# Patient Record
Sex: Male | Born: 1960 | Race: White | Hispanic: No | Marital: Married | Smoking: Never smoker
Health system: Southern US, Community
[De-identification: ages and names within clinical notes are randomized; demographics above are authoritative.]

## PROBLEM LIST (undated history)

## (undated) DIAGNOSIS — C801 Malignant (primary) neoplasm, unspecified: Secondary | ICD-10-CM

## (undated) DIAGNOSIS — M1990 Unspecified osteoarthritis, unspecified site: Secondary | ICD-10-CM

## (undated) DIAGNOSIS — R112 Nausea with vomiting, unspecified: Secondary | ICD-10-CM

## (undated) DIAGNOSIS — Z9889 Other specified postprocedural states: Secondary | ICD-10-CM

## (undated) HISTORY — PX: LITHOTRIPSY: SUR834

---

## 1995-09-15 HISTORY — PX: MICRODISCECTOMY LUMBAR: SUR864

## 2002-12-26 ENCOUNTER — Encounter: Payer: Self-pay | Admitting: Family Medicine

## 2002-12-26 ENCOUNTER — Encounter: Admission: RE | Admit: 2002-12-26 | Discharge: 2002-12-26 | Payer: Self-pay | Admitting: Family Medicine

## 2005-01-16 ENCOUNTER — Encounter: Admission: RE | Admit: 2005-01-16 | Discharge: 2005-01-16 | Payer: Self-pay | Admitting: Family Medicine

## 2007-09-15 HISTORY — PX: CYSTOSCOPY/RETROGRADE/URETEROSCOPY: SHX5316

## 2008-03-02 ENCOUNTER — Emergency Department (HOSPITAL_COMMUNITY): Admission: EM | Admit: 2008-03-02 | Discharge: 2008-03-02 | Payer: Self-pay | Admitting: Emergency Medicine

## 2008-03-02 ENCOUNTER — Ambulatory Visit (HOSPITAL_COMMUNITY): Admission: RE | Admit: 2008-03-02 | Discharge: 2008-03-02 | Payer: Self-pay | Admitting: Urology

## 2008-03-15 ENCOUNTER — Ambulatory Visit (HOSPITAL_COMMUNITY): Admission: RE | Admit: 2008-03-15 | Discharge: 2008-03-15 | Payer: Self-pay | Admitting: Urology

## 2009-04-20 IMAGING — CR DG ABDOMEN 1V
2 series · 2 of 2 positions shown · non-contrast
Comparison: CT of abdomen dated 03/02/2008

CLINICAL DATA: Right ureteral calculus.  The patient is scheduled
for lithotripsy.

ABDOMEN - 1 VIEW

[t abdomen supine (1 of 2)]
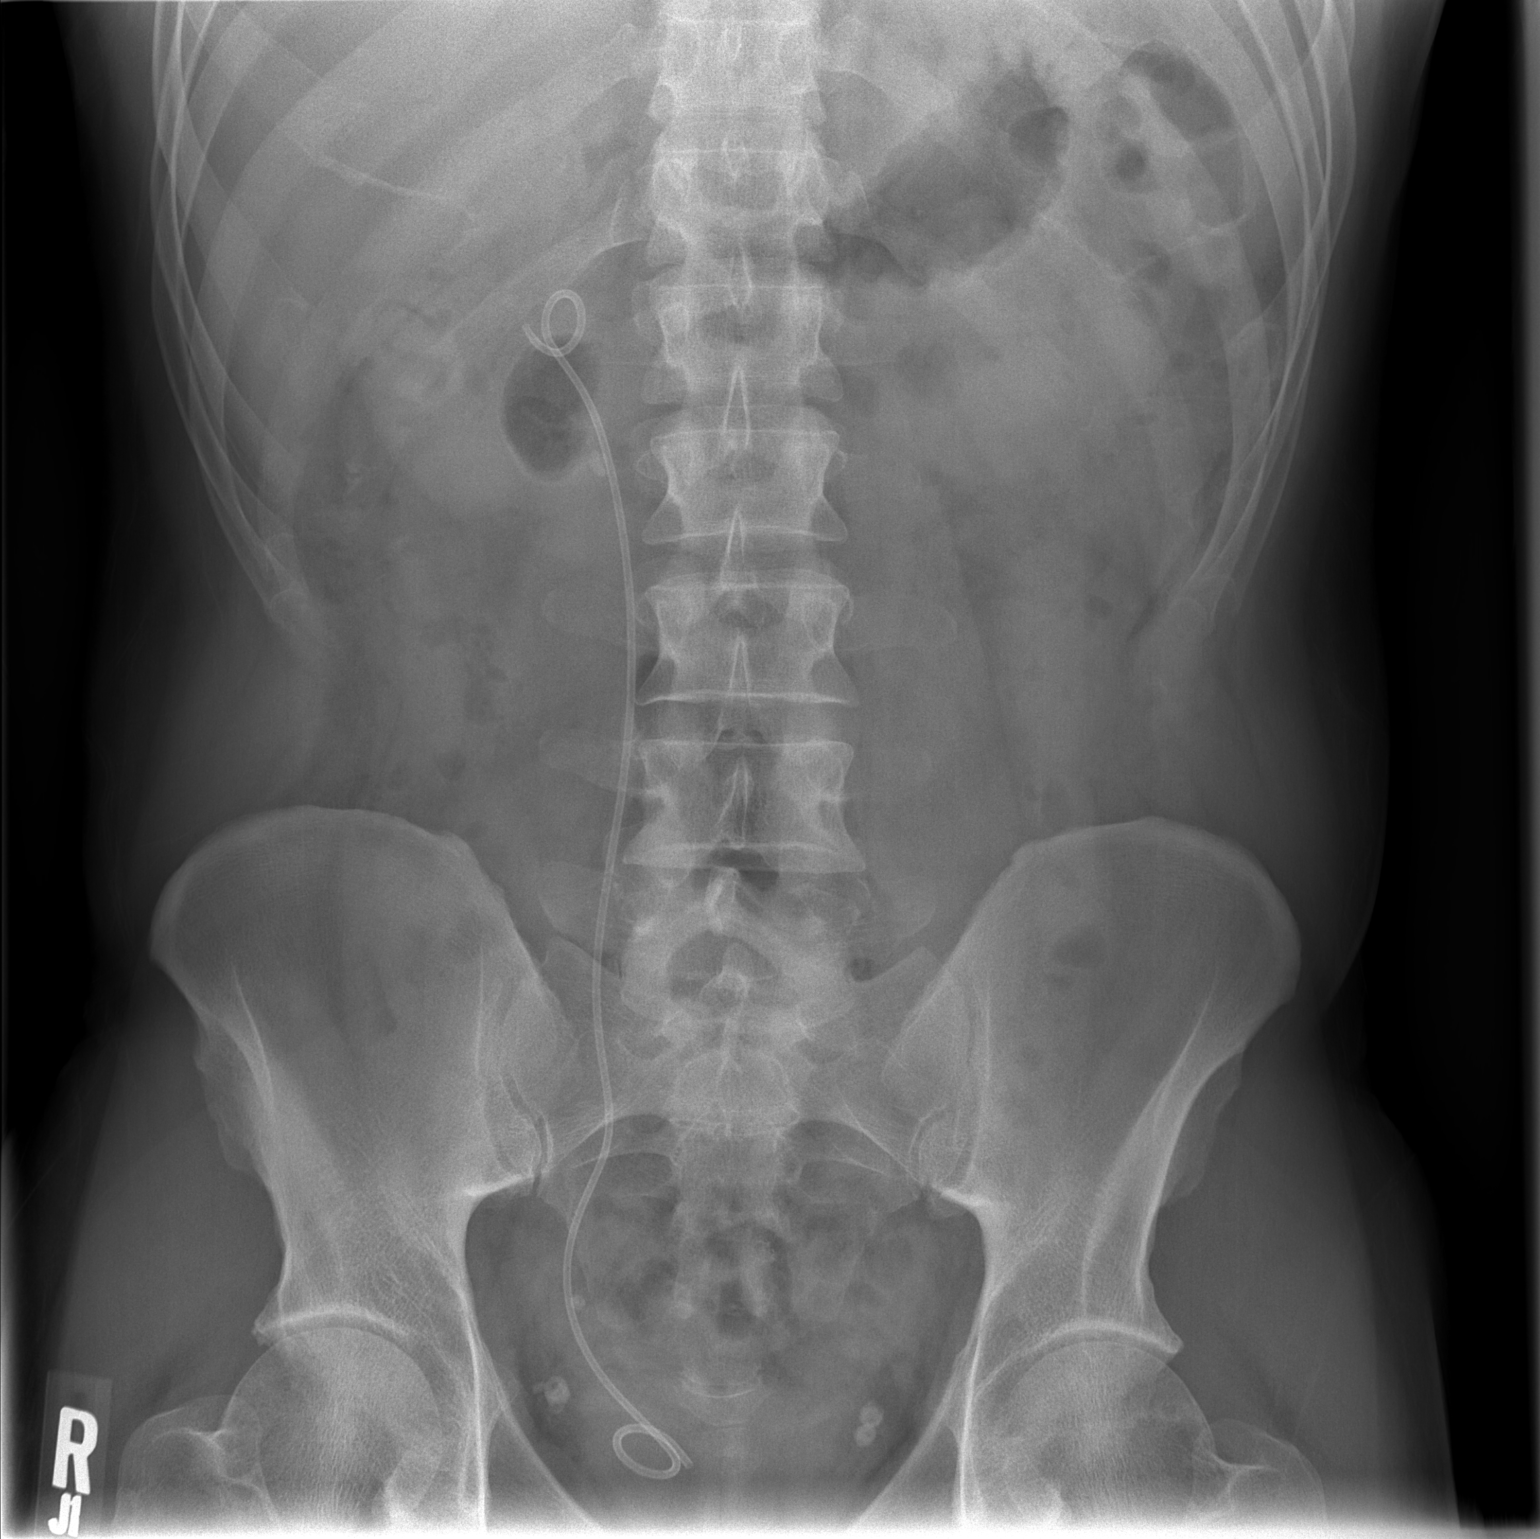

[t abdomen supine (2 of 2)]
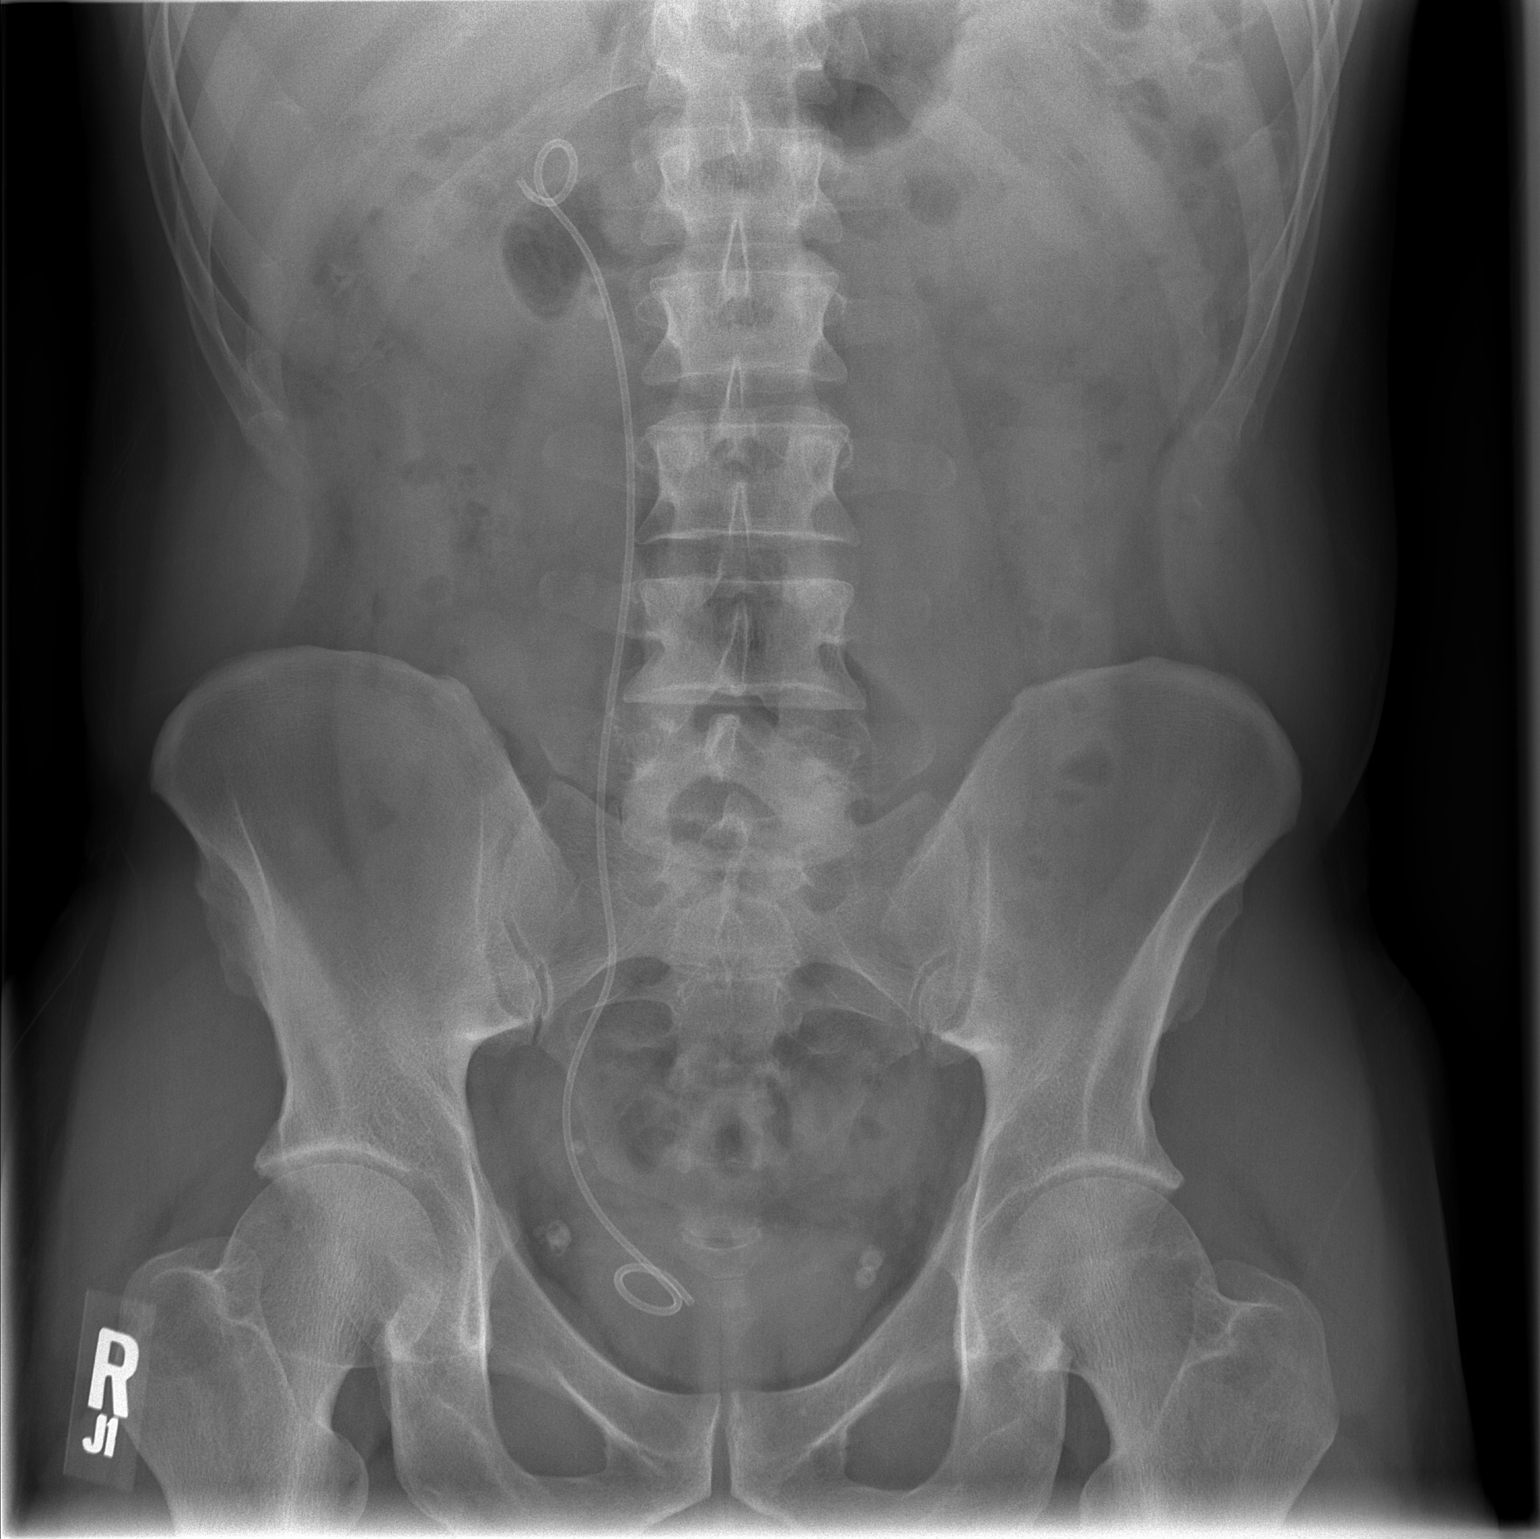

[2 of 2 positions shown; findings below may reference images not displayed]

FINDINGS: Interval right ureteral stent placement extending from
kidney to bladder.  Adjacent ureteral calculus again present in the
proximal right ureter.  The calculus is taller than it is wide with
maximal diameter of approximately 8 mm.  No other renal calculi
present.  Calcified phleboliths are present in the pelvis.  No
evidence of bowel obstruction.
IMPRESSION: 8 mm calculus in the proximal right ureter adjacent to the ureteral
stent.

## 2010-01-10 ENCOUNTER — Ambulatory Visit (HOSPITAL_BASED_OUTPATIENT_CLINIC_OR_DEPARTMENT_OTHER): Admission: RE | Admit: 2010-01-10 | Discharge: 2010-01-10 | Payer: Self-pay | Admitting: Orthopedic Surgery

## 2010-09-14 HISTORY — PX: PARTIAL COLECTOMY: SHX5273

## 2010-09-14 HISTORY — PX: PORT-A-CATH REMOVAL: SHX5289

## 2010-12-02 LAB — POCT HEMOGLOBIN-HEMACUE: Hemoglobin: 17.3 g/dL — ABNORMAL HIGH (ref 13.0–17.0)

## 2011-01-27 NOTE — Op Note (Signed)
Derek Crosby, Derek Crosby                 ACCOUNT NO.:  [id number]   MEDICAL RECORD NO.:  [id number]          PATIENT TYPE:  AMB   LOCATION:  DAY                          FACILITY:  [facility]   PHYSICIAN:  Jamison Neighbor, M.D.  DATE OF BIRTH:  May 04, 1961   DATE OF PROCEDURE:  03/02/2008  DATE OF DISCHARGE:                               OPERATIVE REPORT   PREOPERATIVE DIAGNOSIS:  Right proximal ureteral calculus.   POSTOPERATIVE DIAGNOSIS:  Right proximal ureteral calculus.   PROCEDURE:  Cystoscopy, right retrograde, right ureteroscopy and right  double-J catheter insertion.   SURGEON:  Dr. Logan Bores.   ANESTHESIA:  Was general.   COMPLICATIONS:  Is inability to detect stone in proximal ureter, stone  most likely migrated back to kidney.   DRAIN:  Is a 6-French x 28-cm double-J catheter.   BRIEF HISTORY:  This 50 year old male was found on CT scan to have a  stone in the proximal ureter.  The patient has significant renal colic  and would like to have a stent placed.  He is now to undergo immediate  lithotripsy, because he has just had some Toradol.  The patient is now  to undergo stent placement with possible ureteroscopy if the stone has  migrated.  He understands the risks and benefits of procedure and gave  full informed consent.   PROCEDURE:  After successful induction of general anesthesia, the  patient was placed in the dorsal position, prepped with Betadine and  draped in the usual sterile fashion.  Cystoscopy was performed, and the  urethra was visualized in its entirety and found to be normal.  Beyond  the verumontanum, there was no prostatic enlargement.  The bladder was  carefully inspected.  No tumors or stones could be seen.  Both ureteral  orifices were normal in configuration and location.  Retrograde studies  were performed on the right-hand side through a 6-French ureteral  catheter.  This showed a normal ureter.  The stone could not be well-  visualized.  There was some  degree of dilation in the collecting system.  Due to the poor quality of the images obtained in Room 8, it was  difficult to see whether the stone was present in that proximal ureter  or not.  A guidewire was passed up to the kidney.  Ureteroscope was then  introduced alongside the guidewire.  The distal ureter was very tight,  so a balloon dilator was used to open that up.  The ureteroscope then  could be advanced all the way to the UPJ, and a stone could not be seen.  This would certainly suggest that the stone had migrated back into the  kidney.  The guidewire was left in place.  The ureteroscope was  withdrawn.  The cystoscope was loaded back over the guidewire.  The  patient had a double-J catheter passed over the guidewire and was  allowed to coil normally in the bladder as well as up within the area of  the pelvis.  The bladder was drained.  The patient was taken to the  recovery in good condition.  He will be sent home with Ditropan and  Pyridium Plus as well as Macrodantin to take while the stent is in  place.  He will continue with his pain medication as needed and will  follow-up with Dr. Vernie Ammons for probable with ESWL.      Jamison Neighbor, M.D.  Electronically Signed     RJE/MEDQ  D:  03/02/2008  T:  03/02/2008  Job:  [id number]

## 2011-01-30 ENCOUNTER — Other Ambulatory Visit: Payer: Self-pay | Admitting: Gastroenterology

## 2011-01-30 DIAGNOSIS — R1011 Right upper quadrant pain: Secondary | ICD-10-CM

## 2011-02-03 ENCOUNTER — Ambulatory Visit
Admission: RE | Admit: 2011-02-03 | Discharge: 2011-02-03 | Disposition: A | Discharge status: Home / Self Care | Payer: BC Managed Care – PPO | Source: Ambulatory Visit | Attending: Gastroenterology | Admitting: Gastroenterology

## 2011-02-03 DIAGNOSIS — R1011 Right upper quadrant pain: Secondary | ICD-10-CM

## 2011-02-04 ENCOUNTER — Other Ambulatory Visit: Payer: Self-pay | Admitting: Gastroenterology

## 2011-02-04 DIAGNOSIS — K7689 Other specified diseases of liver: Secondary | ICD-10-CM

## 2011-02-05 ENCOUNTER — Other Ambulatory Visit (HOSPITAL_COMMUNITY): Payer: Self-pay | Admitting: Gastroenterology

## 2011-02-05 DIAGNOSIS — C189 Malignant neoplasm of colon, unspecified: Secondary | ICD-10-CM

## 2011-02-11 ENCOUNTER — Encounter (HOSPITAL_COMMUNITY): Payer: Self-pay

## 2011-02-11 ENCOUNTER — Encounter (HOSPITAL_COMMUNITY)
Admission: RE | Admit: 2011-02-11 | Discharge: 2011-02-11 | Disposition: A | Discharge status: Home / Self Care | Payer: BC Managed Care – PPO | Source: Ambulatory Visit | Attending: Gastroenterology | Admitting: Gastroenterology

## 2011-02-11 ENCOUNTER — Other Ambulatory Visit: Payer: BC Managed Care – PPO

## 2011-02-11 DIAGNOSIS — C189 Malignant neoplasm of colon, unspecified: Secondary | ICD-10-CM | POA: Insufficient documentation

## 2011-02-11 HISTORY — DX: Malignant (primary) neoplasm, unspecified: C80.1

## 2011-02-11 LAB — GLUCOSE, CAPILLARY: Glucose-Capillary: 105 mg/dL — ABNORMAL HIGH (ref 70–99)

## 2011-02-11 MED ORDER — FLUDEOXYGLUCOSE F - 18 (FDG) INJECTION
19.5000 | Freq: Once | INTRAVENOUS | Status: AC | PRN
  Administered 2011-02-11: 19.5 via INTRAVENOUS

## 2011-04-30 ENCOUNTER — Encounter: Payer: BC Managed Care – PPO | Admitting: Oncology

## 2011-05-05 ENCOUNTER — Encounter (HOSPITAL_BASED_OUTPATIENT_CLINIC_OR_DEPARTMENT_OTHER): Payer: BC Managed Care – PPO | Admitting: Oncology

## 2011-05-05 ENCOUNTER — Other Ambulatory Visit: Payer: Self-pay | Admitting: Oncology

## 2011-05-05 DIAGNOSIS — C189 Malignant neoplasm of colon, unspecified: Secondary | ICD-10-CM

## 2011-05-05 DIAGNOSIS — C187 Malignant neoplasm of sigmoid colon: Secondary | ICD-10-CM

## 2011-05-11 ENCOUNTER — Encounter (HOSPITAL_BASED_OUTPATIENT_CLINIC_OR_DEPARTMENT_OTHER): Payer: BC Managed Care – PPO | Admitting: Oncology

## 2011-05-11 ENCOUNTER — Other Ambulatory Visit: Payer: Self-pay | Admitting: Oncology

## 2011-05-11 DIAGNOSIS — C187 Malignant neoplasm of sigmoid colon: Secondary | ICD-10-CM

## 2011-05-11 LAB — CBC WITH DIFFERENTIAL/PLATELET
Basophils Absolute: 0 10*3/uL (ref 0.0–0.1)
EOS%: 0.7 % (ref 0.0–7.0)
HCT: 46.7 % (ref 38.4–49.9)
HGB: 16.6 g/dL (ref 13.0–17.1)
LYMPH%: 31.6 % (ref 14.0–49.0)
MCH: 32.1 pg (ref 27.2–33.4)
MONO#: 0.3 10*3/uL (ref 0.1–0.9)
NEUT%: 60.1 % (ref 39.0–75.0)
Platelets: 173 10*3/uL (ref 140–400)
lymph#: 1.4 10*3/uL (ref 0.9–3.3)

## 2011-05-11 LAB — COMPREHENSIVE METABOLIC PANEL
Albumin: 4.7 g/dL (ref 3.5–5.2)
BUN: 24 mg/dL — ABNORMAL HIGH (ref 6–23)
CO2: 28 mEq/L (ref 19–32)
Calcium: 10.1 mg/dL (ref 8.4–10.5)
Chloride: 99 mEq/L (ref 96–112)
Glucose, Bld: 88 mg/dL (ref 70–99)
Potassium: 4.4 mEq/L (ref 3.5–5.3)
Sodium: 140 mEq/L (ref 135–145)
Total Protein: 7.7 g/dL (ref 6.0–8.3)

## 2011-05-11 LAB — CEA: CEA: 1 ng/mL (ref 0.0–5.0)

## 2011-05-20 ENCOUNTER — Ambulatory Visit (HOSPITAL_COMMUNITY)
Admission: RE | Admit: 2011-05-20 | Discharge: 2011-05-20 | Disposition: A | Discharge status: Home / Self Care | Payer: BC Managed Care – PPO | Source: Ambulatory Visit | Attending: Oncology | Admitting: Oncology

## 2011-05-20 DIAGNOSIS — C189 Malignant neoplasm of colon, unspecified: Secondary | ICD-10-CM | POA: Insufficient documentation

## 2011-05-21 ENCOUNTER — Other Ambulatory Visit: Payer: Self-pay | Admitting: Oncology

## 2011-05-21 ENCOUNTER — Encounter (HOSPITAL_BASED_OUTPATIENT_CLINIC_OR_DEPARTMENT_OTHER): Payer: BC Managed Care – PPO | Admitting: Oncology

## 2011-05-21 DIAGNOSIS — C187 Malignant neoplasm of sigmoid colon: Secondary | ICD-10-CM

## 2011-05-21 DIAGNOSIS — Z5111 Encounter for antineoplastic chemotherapy: Secondary | ICD-10-CM

## 2011-05-21 LAB — CBC WITH DIFFERENTIAL/PLATELET
BASO%: 0.6 % (ref 0.0–2.0)
LYMPH%: 29.1 % (ref 14.0–49.0)
MCHC: 35.8 g/dL (ref 32.0–36.0)
MONO#: 0.4 10*3/uL (ref 0.1–0.9)
NEUT#: 2.9 10*3/uL (ref 1.5–6.5)
Platelets: 145 10*3/uL (ref 140–400)
RBC: 5.13 10*6/uL (ref 4.20–5.82)
RDW: 12.1 % (ref 11.0–14.6)
WBC: 4.7 10*3/uL (ref 4.0–10.3)
lymph#: 1.4 10*3/uL (ref 0.9–3.3)
nRBC: 0 % (ref 0–0)

## 2011-05-21 LAB — COMPREHENSIVE METABOLIC PANEL
ALT: 17 U/L (ref 0–53)
AST: 20 U/L (ref 0–37)
Calcium: 9.8 mg/dL (ref 8.4–10.5)
Chloride: 102 mEq/L (ref 96–112)
Creatinine, Ser: 0.88 mg/dL (ref 0.50–1.35)
Sodium: 137 mEq/L (ref 135–145)
Total Protein: 7.2 g/dL (ref 6.0–8.3)

## 2011-05-23 ENCOUNTER — Encounter (HOSPITAL_BASED_OUTPATIENT_CLINIC_OR_DEPARTMENT_OTHER): Payer: BC Managed Care – PPO | Admitting: Oncology

## 2011-05-23 DIAGNOSIS — C187 Malignant neoplasm of sigmoid colon: Secondary | ICD-10-CM

## 2011-06-04 ENCOUNTER — Encounter (HOSPITAL_BASED_OUTPATIENT_CLINIC_OR_DEPARTMENT_OTHER): Payer: BC Managed Care – PPO | Admitting: Oncology

## 2011-06-04 ENCOUNTER — Other Ambulatory Visit: Payer: Self-pay | Admitting: Oncology

## 2011-06-04 DIAGNOSIS — C187 Malignant neoplasm of sigmoid colon: Secondary | ICD-10-CM

## 2011-06-04 DIAGNOSIS — Z5111 Encounter for antineoplastic chemotherapy: Secondary | ICD-10-CM

## 2011-06-04 LAB — COMPREHENSIVE METABOLIC PANEL
ALT: 19 U/L (ref 0–53)
Albumin: 3.8 g/dL (ref 3.5–5.2)
CO2: 29 mEq/L (ref 19–32)
Chloride: 100 mEq/L (ref 96–112)
Glucose, Bld: 109 mg/dL — ABNORMAL HIGH (ref 70–99)
Potassium: 3.9 mEq/L (ref 3.5–5.3)
Sodium: 139 mEq/L (ref 135–145)
Total Protein: 7.4 g/dL (ref 6.0–8.3)

## 2011-06-04 LAB — CBC WITH DIFFERENTIAL/PLATELET
BASO%: 0.4 % (ref 0.0–2.0)
Eosinophils Absolute: 0 10*3/uL (ref 0.0–0.5)
MCHC: 35.8 g/dL (ref 32.0–36.0)
MONO#: 0.3 10*3/uL (ref 0.1–0.9)
NEUT#: 2.2 10*3/uL (ref 1.5–6.5)
Platelets: 160 10*3/uL (ref 140–400)
RBC: 5.01 10*6/uL (ref 4.20–5.82)
RDW: 12.6 % (ref 11.0–14.6)
WBC: 3.6 10*3/uL — ABNORMAL LOW (ref 4.0–10.3)
lymph#: 1.1 10*3/uL (ref 0.9–3.3)

## 2011-06-06 ENCOUNTER — Encounter: Payer: BC Managed Care – PPO | Admitting: Oncology

## 2011-06-06 DIAGNOSIS — Z452 Encounter for adjustment and management of vascular access device: Secondary | ICD-10-CM

## 2011-06-11 LAB — URINE MICROSCOPIC-ADD ON

## 2011-06-11 LAB — URINALYSIS, ROUTINE W REFLEX MICROSCOPIC
Glucose, UA: NEGATIVE
Ketones, ur: NEGATIVE
Leukocytes, UA: NEGATIVE
Protein, ur: NEGATIVE
Urobilinogen, UA: 0.2

## 2011-06-11 LAB — HEMOGLOBIN AND HEMATOCRIT, BLOOD: Hemoglobin: 16.4

## 2011-06-18 ENCOUNTER — Other Ambulatory Visit: Payer: Self-pay | Admitting: Oncology

## 2011-06-18 ENCOUNTER — Encounter (HOSPITAL_BASED_OUTPATIENT_CLINIC_OR_DEPARTMENT_OTHER): Payer: BC Managed Care – PPO | Admitting: Oncology

## 2011-06-18 DIAGNOSIS — Z5111 Encounter for antineoplastic chemotherapy: Secondary | ICD-10-CM

## 2011-06-18 DIAGNOSIS — C187 Malignant neoplasm of sigmoid colon: Secondary | ICD-10-CM

## 2011-06-18 LAB — CBC WITH DIFFERENTIAL/PLATELET
BASO%: 0.4 % (ref 0.0–2.0)
Eosinophils Absolute: 0.1 10*3/uL (ref 0.0–0.5)
MCHC: 36.2 g/dL — ABNORMAL HIGH (ref 32.0–36.0)
MONO#: 0.5 10*3/uL (ref 0.1–0.9)
NEUT#: 2.1 10*3/uL (ref 1.5–6.5)
RBC: 4.97 10*6/uL (ref 4.20–5.82)
RDW: 12.8 % (ref 11.0–14.6)
WBC: 3.9 10*3/uL — ABNORMAL LOW (ref 4.0–10.3)
lymph#: 1.2 10*3/uL (ref 0.9–3.3)

## 2011-06-18 LAB — COMPREHENSIVE METABOLIC PANEL
ALT: 20 U/L (ref 0–53)
Albumin: 4.3 g/dL (ref 3.5–5.2)
CO2: 25 mEq/L (ref 19–32)
Glucose, Bld: 95 mg/dL (ref 70–99)
Potassium: 4.3 mEq/L (ref 3.5–5.3)
Sodium: 137 mEq/L (ref 135–145)
Total Bilirubin: 0.4 mg/dL (ref 0.3–1.2)
Total Protein: 7.3 g/dL (ref 6.0–8.3)

## 2011-06-20 ENCOUNTER — Encounter: Payer: BC Managed Care – PPO | Admitting: Oncology

## 2011-06-20 DIAGNOSIS — Z452 Encounter for adjustment and management of vascular access device: Secondary | ICD-10-CM

## 2011-07-02 ENCOUNTER — Other Ambulatory Visit: Payer: Self-pay | Admitting: Oncology

## 2011-07-02 ENCOUNTER — Encounter (HOSPITAL_BASED_OUTPATIENT_CLINIC_OR_DEPARTMENT_OTHER): Payer: BC Managed Care – PPO | Admitting: Oncology

## 2011-07-02 DIAGNOSIS — C187 Malignant neoplasm of sigmoid colon: Secondary | ICD-10-CM

## 2011-07-02 DIAGNOSIS — Z5111 Encounter for antineoplastic chemotherapy: Secondary | ICD-10-CM

## 2011-07-02 LAB — CBC WITH DIFFERENTIAL/PLATELET
BASO%: 0.5 % (ref 0.0–2.0)
Eosinophils Absolute: 0.1 10*3/uL (ref 0.0–0.5)
LYMPH%: 26.1 % (ref 14.0–49.0)
MCHC: 36 g/dL (ref 32.0–36.0)
MCV: 90.1 fL (ref 79.3–98.0)
MONO#: 0.4 10*3/uL (ref 0.1–0.9)
MONO%: 9.1 % (ref 0.0–14.0)
NEUT#: 2.7 10*3/uL (ref 1.5–6.5)
Platelets: 103 10*3/uL — ABNORMAL LOW (ref 140–400)
RBC: 4.68 10*6/uL (ref 4.20–5.82)
RDW: 13.3 % (ref 11.0–14.6)
WBC: 4.2 10*3/uL (ref 4.0–10.3)

## 2011-07-02 LAB — COMPREHENSIVE METABOLIC PANEL
ALT: 28 U/L (ref 0–53)
Albumin: 3.7 g/dL (ref 3.5–5.2)
Alkaline Phosphatase: 67 U/L (ref 39–117)
Potassium: 3.8 mEq/L (ref 3.5–5.3)
Sodium: 139 mEq/L (ref 135–145)
Total Bilirubin: 0.5 mg/dL (ref 0.3–1.2)
Total Protein: 7.2 g/dL (ref 6.0–8.3)

## 2011-07-04 ENCOUNTER — Encounter: Payer: BC Managed Care – PPO | Admitting: Oncology

## 2011-07-04 DIAGNOSIS — Z452 Encounter for adjustment and management of vascular access device: Secondary | ICD-10-CM

## 2011-07-16 ENCOUNTER — Other Ambulatory Visit: Payer: Self-pay | Admitting: Oncology

## 2011-07-16 ENCOUNTER — Encounter (HOSPITAL_BASED_OUTPATIENT_CLINIC_OR_DEPARTMENT_OTHER): Payer: BC Managed Care – PPO | Admitting: Oncology

## 2011-07-16 DIAGNOSIS — Z5111 Encounter for antineoplastic chemotherapy: Secondary | ICD-10-CM

## 2011-07-16 DIAGNOSIS — C187 Malignant neoplasm of sigmoid colon: Secondary | ICD-10-CM

## 2011-07-16 DIAGNOSIS — Z23 Encounter for immunization: Secondary | ICD-10-CM

## 2011-07-16 DIAGNOSIS — R5383 Other fatigue: Secondary | ICD-10-CM

## 2011-07-16 DIAGNOSIS — R5381 Other malaise: Secondary | ICD-10-CM

## 2011-07-16 LAB — COMPREHENSIVE METABOLIC PANEL
ALT: 70 U/L — ABNORMAL HIGH (ref 0–53)
Alkaline Phosphatase: 69 U/L (ref 39–117)
CO2: 28 mEq/L (ref 19–32)
Creatinine, Ser: 1 mg/dL (ref 0.50–1.35)
Total Bilirubin: 0.6 mg/dL (ref 0.3–1.2)

## 2011-07-16 LAB — CBC WITH DIFFERENTIAL/PLATELET
BASO%: 0.3 % (ref 0.0–2.0)
EOS%: 2.4 % (ref 0.0–7.0)
Eosinophils Absolute: 0.1 10*3/uL (ref 0.0–0.5)
LYMPH%: 35.7 % (ref 14.0–49.0)
MCH: 31.5 pg (ref 27.2–33.4)
MCHC: 36.6 g/dL — ABNORMAL HIGH (ref 32.0–36.0)
MCV: 86 fL (ref 79.3–98.0)
MONO%: 9.5 % (ref 0.0–14.0)
Platelets: 91 10*3/uL — ABNORMAL LOW (ref 140–400)
RBC: 4.92 10*6/uL (ref 4.20–5.82)
RDW: 14.2 % (ref 11.0–14.6)
nRBC: 0 % (ref 0–0)

## 2011-07-18 ENCOUNTER — Encounter (HOSPITAL_BASED_OUTPATIENT_CLINIC_OR_DEPARTMENT_OTHER): Payer: BC Managed Care – PPO | Admitting: Oncology

## 2011-07-18 DIAGNOSIS — C187 Malignant neoplasm of sigmoid colon: Secondary | ICD-10-CM

## 2011-07-29 ENCOUNTER — Other Ambulatory Visit: Payer: Self-pay | Admitting: Oncology

## 2011-07-29 DIAGNOSIS — C189 Malignant neoplasm of colon, unspecified: Secondary | ICD-10-CM | POA: Insufficient documentation

## 2011-07-30 ENCOUNTER — Encounter: Payer: BC Managed Care – PPO | Admitting: Nutrition

## 2011-07-30 ENCOUNTER — Other Ambulatory Visit (HOSPITAL_BASED_OUTPATIENT_CLINIC_OR_DEPARTMENT_OTHER): Payer: BC Managed Care – PPO

## 2011-07-30 ENCOUNTER — Other Ambulatory Visit: Payer: Self-pay | Admitting: Oncology

## 2011-07-30 ENCOUNTER — Other Ambulatory Visit: Payer: Self-pay

## 2011-07-30 ENCOUNTER — Ambulatory Visit (HOSPITAL_COMMUNITY)
Admission: RE | Admit: 2011-07-30 | Discharge: 2011-07-30 | Disposition: A | Discharge status: Home / Self Care | Payer: BC Managed Care – PPO | Source: Ambulatory Visit | Attending: Nurse Practitioner | Admitting: Nurse Practitioner

## 2011-07-30 ENCOUNTER — Ambulatory Visit (HOSPITAL_BASED_OUTPATIENT_CLINIC_OR_DEPARTMENT_OTHER): Payer: BC Managed Care – PPO

## 2011-07-30 ENCOUNTER — Other Ambulatory Visit: Payer: Self-pay | Admitting: *Deleted

## 2011-07-30 ENCOUNTER — Other Ambulatory Visit: Payer: Self-pay | Admitting: Nurse Practitioner

## 2011-07-30 ENCOUNTER — Ambulatory Visit (HOSPITAL_BASED_OUTPATIENT_CLINIC_OR_DEPARTMENT_OTHER): Payer: BC Managed Care – PPO | Admitting: Nurse Practitioner

## 2011-07-30 VITALS — BP 147/89 | HR 81 | Temp 98.4°F | Ht 70.5 in | Wt 183.3 lb

## 2011-07-30 DIAGNOSIS — C187 Malignant neoplasm of sigmoid colon: Secondary | ICD-10-CM

## 2011-07-30 DIAGNOSIS — Z5111 Encounter for antineoplastic chemotherapy: Secondary | ICD-10-CM

## 2011-07-30 DIAGNOSIS — R52 Pain, unspecified: Secondary | ICD-10-CM

## 2011-07-30 DIAGNOSIS — C189 Malignant neoplasm of colon, unspecified: Secondary | ICD-10-CM

## 2011-07-30 DIAGNOSIS — R0789 Other chest pain: Secondary | ICD-10-CM | POA: Insufficient documentation

## 2011-07-30 DIAGNOSIS — D696 Thrombocytopenia, unspecified: Secondary | ICD-10-CM

## 2011-07-30 LAB — COMPREHENSIVE METABOLIC PANEL
ALT: 91 U/L — ABNORMAL HIGH (ref 0–53)
AST: 71 U/L — ABNORMAL HIGH (ref 0–37)
Albumin: 3.7 g/dL (ref 3.5–5.2)
Alkaline Phosphatase: 66 U/L (ref 39–117)
Calcium: 9.6 mg/dL (ref 8.4–10.5)
Chloride: 101 mEq/L (ref 96–112)
Potassium: 3.7 mEq/L (ref 3.5–5.3)
Sodium: 137 mEq/L (ref 135–145)
Total Protein: 7.2 g/dL (ref 6.0–8.3)

## 2011-07-30 LAB — CBC WITH DIFFERENTIAL/PLATELET
EOS%: 1 % (ref 0.0–7.0)
LYMPH%: 36.5 % (ref 14.0–49.0)
MCH: 31.5 pg (ref 27.2–33.4)
MCHC: 36.6 g/dL — ABNORMAL HIGH (ref 32.0–36.0)
MCV: 86.3 fL (ref 79.3–98.0)
MONO%: 11.1 % (ref 0.0–14.0)
Platelets: 80 10*3/uL — ABNORMAL LOW (ref 140–400)
RBC: 4.66 10*6/uL (ref 4.20–5.82)

## 2011-07-30 MED ORDER — HEPARIN SOD (PORK) LOCK FLUSH 100 UNIT/ML IV SOLN
500.0000 [IU] | Freq: Once | INTRAVENOUS | Status: DC | PRN
  Filled 2011-07-30: qty 5

## 2011-07-30 MED ORDER — FLUOROURACIL CHEMO INJECTION 2.5 GM/50ML
400.0000 mg/m2 | Freq: Once | INTRAVENOUS | Status: AC
  Administered 2011-07-30: 800 mg via INTRAVENOUS
  Filled 2011-07-30: qty 16

## 2011-07-30 MED ORDER — DEXTROSE 5 % IV SOLN
395.0000 mg/m2 | Freq: Once | INTRAVENOUS | Status: AC
  Administered 2011-07-30: 798 mg via INTRAVENOUS
  Filled 2011-07-30: qty 39.9

## 2011-07-30 MED ORDER — SODIUM CHLORIDE 0.9 % IJ SOLN
10.0000 mL | INTRAMUSCULAR | Status: DC | PRN
  Filled 2011-07-30: qty 10

## 2011-07-30 MED ORDER — DEXTROSE 5 % IV SOLN
Freq: Once | INTRAVENOUS | Status: AC
  Administered 2011-07-30: 11:00:00 via INTRAVENOUS

## 2011-07-30 MED ORDER — IOHEXOL 300 MG/ML  SOLN
50.0000 mL | Freq: Once | INTRAMUSCULAR | Status: AC | PRN
  Administered 2011-07-30: 18 mL

## 2011-07-30 MED ORDER — SODIUM CHLORIDE 0.9 % IV SOLN
2375.0000 mg/m2 | INTRAVENOUS | Status: DC
  Filled 2011-07-30: qty 96

## 2011-07-30 NOTE — Progress Notes (Signed)
Radiology: Port injection.  Right power port was flushed with ominpaque.  No resistance to flow. No leak at hub or along catheter.  Contrast flows readily from the catheter tip.  Ready for use.

## 2011-07-30 NOTE — Assessment & Plan Note (Signed)
This is a 50 year old male patient of Dr. Truett Perna diagnosed with sigmoid colon cancer.  PAST MEDICAL HISTORY:  Includes sigmoid colectomy and kidney stones.  MEDICATIONS:  Include Compazine, fish oil, vitamin D, multivitamin, Zofran, and Zantac.  LABORATORIES:  Labs include glucose of 117.  HEIGHT:  70.5 inches. WEIGHT:  183 pounds. USUAL BODY WEIGHT:  Approximately 180 pounds. BMI:  23.4.  The patient reports problems with appetite.  He is having some difficulty swallowing and experiences some taste alterations.  He denies vomiting.  He has not had weight loss.  He reports that his doctor feels that the issue he has with swallowing is related to his oxaliplatin, which they are hoping is going to improve after he has been off of his oxaliplatin for several weeks.  NUTRITION DIAGNOSIS:  Food and nutrition-related knowledge deficit related to new diagnosis of sigmoid colon cancer and associated treatments, as evidenced by no prior need for nutrition-related information.  INTERVENTION:  I educated the patient and his wife on strategies for eating small frequent meals throughout the day.  We discussed strategies for improving taste of food.  He has been advised to avoid acidic foods or foods that cause his reflux to be worse.  I provided a fact sheet with some contact information in case he develops nutrition related side effects or weightloss during treatment.  MONITORING/EVALUATION (GOALS):  The patient will tolerate oral diet to minimize side effects throughout treatment.  NEXT VISIT:  There is no followup scheduled.  The patient has my contact information for questions or concerns.    ______________________________ Zenovia Jarred, RD, LDN Clinical Nutrition Specialist BN/MEDQ  D:  07/30/2011  T:  07/30/2011  Job:  [id number]

## 2011-07-30 NOTE — Patient Instructions (Signed)
[facility] Discharge Instructions for Patients Receiving Chemotherapy  Today you received the following chemotherapy agents Leucovorin/5-FU  To help prevent nausea and vomiting after your treatment, we encourage you to take your nausea medication as directed by MD   If you develop nausea and vomiting that is not controlled by your nausea medication, call the clinic. If it is after clinic hours your family physician or the after hours number for the clinic or go to the Emergency Department.   BELOW ARE SYMPTOMS THAT SHOULD BE REPORTED IMMEDIATELY:  *FEVER GREATER THAN 100.5 F  *CHILLS WITH OR WITHOUT FEVER  NAUSEA AND VOMITING THAT IS NOT CONTROLLED WITH YOUR NAUSEA MEDICATION  *UNUSUAL SHORTNESS OF BREATH  *UNUSUAL BRUISING OR BLEEDING  TENDERNESS IN MOUTH AND THROAT WITH OR WITHOUT PRESENCE OF ULCERS  *URINARY PROBLEMS  *BOWEL PROBLEMS  UNUSUAL RASH Items with * indicate a potential emergency and should be followed up as soon as possible.  One of the nurses will contact you 24 hours after your treatment. Please let the nurse know about any problems that you may have experienced. Feel free to call the clinic you have any questions or concerns. The clinic phone number is [phone].   I have been informed and understand all the instructions given to me. I know to contact the clinic, my physician, or go to the Emergency Department if any problems should occur. I do not have any questions at this time, but understand that I may call the clinic during office hours   should I have any questions or need assistance in obtaining follow up care.    __________________________________________  _____________  __________ Signature of Patient or Authorized Representative            Date                   Time    __________________________________________ Nurse's Signature

## 2011-07-30 NOTE — Progress Notes (Signed)
OFFICE PROGRESS NOTE  Interval history:  Derek Crosby returns as scheduled. He completed cycle 5 FOLFOX 07/16/2011. He has mild intermittent nausea. No vomiting. He had one episode of diarrhea. The diarrhea occurred greater than one week after treatment. He denies mouth sores. He continues to note an abnormal sensation with swallowing. The sensation tends to occur with intake of cold foods or beverages. He notes persistent cold sensitivity in the hands and feet. He has mild intermittent numbness in the fingertips in the absence of cold exposure. Recently he has noted nasal congestion at nighttime. When he blows his nose in the morning he sees a small amount of blood. No other bleeding.   Objective: Blood pressure 147/89, pulse 81, temperature 98.4 F (36.9 C), temperature source Oral, height 5' 10.5" (1.791 m), weight 183 lb 4.8 oz (83.144 kg).  Oropharynx is without thrush or ulceration. Lungs are clear. No wheezes or rales. Regular cardiac rhythm. Port-A-Cath site is nontender without erythema. Abdomen is soft and nontender. No hepatomegaly. Extremities are without edema. Calves are soft and nontender. Vibratory sense is intact over the fingertips per tuning fork exam.   Lab Results: Hemoglobin 14.7 white count 2.9 absolute neutrophil count 1.5 platelet count 80,000 chemistry panel pending      Studies/Results: No results found.  Medications: I have reviewed the patient's current medications.  Assessment/Plan: 1. 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  Microsatellite instability testing by immunohistochemistry confirmed a microsatellite stable tumor.  He began adjuvant FOLFOX chemotherapy 05/21/2011.  He completed cycle #5 on 07/16/2011.   2. Delayed nausea, improved with the addition of Aloxi. 3. Status post Port-A-Cath placement 05/20/2011. 4. 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 5. Kidney stone in 2009. 6. L5-S1 disk surgery in 1997. 7. Low heart rate, likely due to his young age and physical conditioning. 8. Thrombocytopenia secondary to chemotherapy.  He knows to contact us for spontaneous bruising or bleeding. 9. Abnormal sensation with swallowing, likely related to oxaliplatin. Persistent. 10. Prolonged cold sensitivity following chemotherapy, secondary to oxaliplatin neuropathy. 11. Hyperpigmented slightly raised skin lesions at the posterior neck.  Likely a benign finding, potentially exacerbated by chemotherapy. 12. Disposition-Derek Crosby will complete cycle #6 of adjuvant FOLFOX chemotherapy today. We will hold oxaliplatin with today's treatment due to thrombocytopenia. He will receive the 5-FU/leucovorin as scheduled per the FOLFOX regimen. Oxaliplatin will be resumed with cycle 7 if the platelet count has adequately recovered. He will return for an office visit and cycle #7 in 2 weeks. Derek Crosby will contact the office prior to his next visit with any problems.  Plan reviewed with Dr. Truett Perna.  Lonna Cobb ANP/GNP-BC

## 2011-07-30 NOTE — Progress Notes (Signed)
1540: Patient complained of discomfort around port-a-cath post 5-FU push; port assessed by charge nurse Zella Ball); patient to Radiology to have dye study, per Lonna Cobb, NP; patient reports negative results and is connected to 5-FU pump; patient states that his port 'feels fine'; patient shows no signs of distress; discharged home with wife with no complaints.

## 2011-07-31 ENCOUNTER — Ambulatory Visit (HOSPITAL_COMMUNITY): Payer: BC Managed Care – PPO

## 2011-07-31 ENCOUNTER — Other Ambulatory Visit: Payer: Self-pay | Admitting: *Deleted

## 2011-08-01 ENCOUNTER — Ambulatory Visit (HOSPITAL_BASED_OUTPATIENT_CLINIC_OR_DEPARTMENT_OTHER): Payer: BC Managed Care – PPO

## 2011-08-01 VITALS — BP 146/90 | HR 79 | Temp 97.5°F

## 2011-08-01 DIAGNOSIS — C187 Malignant neoplasm of sigmoid colon: Secondary | ICD-10-CM

## 2011-08-01 DIAGNOSIS — C189 Malignant neoplasm of colon, unspecified: Secondary | ICD-10-CM

## 2011-08-01 MED ORDER — HEPARIN SOD (PORK) LOCK FLUSH 100 UNIT/ML IV SOLN
500.0000 [IU] | Freq: Once | INTRAVENOUS | Status: AC | PRN
  Administered 2011-08-01: 500 [IU]
  Filled 2011-08-01: qty 5

## 2011-08-01 MED ORDER — SODIUM CHLORIDE 0.9 % IJ SOLN
10.0000 mL | INTRAMUSCULAR | Status: DC | PRN
  Administered 2011-08-01: 10 mL
  Filled 2011-08-01: qty 10

## 2011-08-12 ENCOUNTER — Other Ambulatory Visit: Payer: Self-pay | Admitting: Oncology

## 2011-08-13 ENCOUNTER — Other Ambulatory Visit (HOSPITAL_BASED_OUTPATIENT_CLINIC_OR_DEPARTMENT_OTHER): Payer: BC Managed Care – PPO | Admitting: Lab

## 2011-08-13 ENCOUNTER — Telehealth: Payer: Self-pay | Admitting: Oncology

## 2011-08-13 ENCOUNTER — Ambulatory Visit (HOSPITAL_BASED_OUTPATIENT_CLINIC_OR_DEPARTMENT_OTHER): Payer: BC Managed Care – PPO

## 2011-08-13 ENCOUNTER — Ambulatory Visit (HOSPITAL_BASED_OUTPATIENT_CLINIC_OR_DEPARTMENT_OTHER): Payer: BC Managed Care – PPO | Admitting: Oncology

## 2011-08-13 ENCOUNTER — Other Ambulatory Visit: Payer: Self-pay | Admitting: Oncology

## 2011-08-13 VITALS — BP 135/82 | HR 74 | Temp 97.3°F | Ht 70.5 in | Wt 185.3 lb

## 2011-08-13 DIAGNOSIS — C189 Malignant neoplasm of colon, unspecified: Secondary | ICD-10-CM

## 2011-08-13 DIAGNOSIS — C187 Malignant neoplasm of sigmoid colon: Secondary | ICD-10-CM

## 2011-08-13 DIAGNOSIS — G620 Drug-induced polyneuropathy: Secondary | ICD-10-CM

## 2011-08-13 DIAGNOSIS — D696 Thrombocytopenia, unspecified: Secondary | ICD-10-CM

## 2011-08-13 DIAGNOSIS — L989 Disorder of the skin and subcutaneous tissue, unspecified: Secondary | ICD-10-CM

## 2011-08-13 DIAGNOSIS — Z5111 Encounter for antineoplastic chemotherapy: Secondary | ICD-10-CM

## 2011-08-13 LAB — CBC WITH DIFFERENTIAL/PLATELET
BASO%: 0.3 % (ref 0.0–2.0)
Basophils Absolute: 0 10*3/uL (ref 0.0–0.1)
EOS%: 1.1 % (ref 0.0–7.0)
HCT: 40.5 % (ref 38.4–49.9)
LYMPH%: 40.8 % (ref 14.0–49.0)
MCH: 32.2 pg (ref 27.2–33.4)
MCHC: 36.5 g/dL — ABNORMAL HIGH (ref 32.0–36.0)
MCV: 88 fL (ref 79.3–98.0)
MONO%: 9.8 % (ref 0.0–14.0)
NEUT%: 48 % (ref 39.0–75.0)
Platelets: 101 10*3/uL — ABNORMAL LOW (ref 140–400)
lymph#: 1.5 10*3/uL (ref 0.9–3.3)

## 2011-08-13 LAB — COMPREHENSIVE METABOLIC PANEL
CO2: 30 mEq/L (ref 19–32)
Calcium: 9.6 mg/dL (ref 8.4–10.5)
Glucose, Bld: 89 mg/dL (ref 70–99)
Sodium: 137 mEq/L (ref 135–145)
Total Bilirubin: 0.6 mg/dL (ref 0.3–1.2)
Total Protein: 7 g/dL (ref 6.0–8.3)

## 2011-08-13 MED ORDER — FLUOROURACIL CHEMO INJECTION 2.5 GM/50ML
400.0000 mg/m2 | Freq: Once | INTRAVENOUS | Status: AC
  Administered 2011-08-13: 800 mg via INTRAVENOUS
  Filled 2011-08-13: qty 16

## 2011-08-13 MED ORDER — LEUCOVORIN CALCIUM INJECTION 350 MG
395.0000 mg/m2 | Freq: Once | INTRAVENOUS | Status: AC
  Administered 2011-08-13: 798 mg via INTRAVENOUS
  Filled 2011-08-13: qty 39.9

## 2011-08-13 MED ORDER — DEXTROSE 5 % IV SOLN
Freq: Once | INTRAVENOUS | Status: AC
  Administered 2011-08-13: 12:00:00 via INTRAVENOUS

## 2011-08-13 MED ORDER — OXALIPLATIN CHEMO INJECTION 100 MG/20ML
68.0000 mg/m2 | Freq: Once | INTRAVENOUS | Status: AC
  Administered 2011-08-13: 135 mg via INTRAVENOUS
  Filled 2011-08-13: qty 27

## 2011-08-13 MED ORDER — SODIUM CHLORIDE 0.9 % IV SOLN
2375.0000 mg/m2 | INTRAVENOUS | Status: DC
  Administered 2011-08-13: 4800 mg via INTRAVENOUS
  Filled 2011-08-13: qty 96

## 2011-08-13 MED ORDER — HEPARIN SOD (PORK) LOCK FLUSH 100 UNIT/ML IV SOLN
500.0000 [IU] | Freq: Once | INTRAVENOUS | Status: DC | PRN
  Filled 2011-08-13: qty 5

## 2011-08-13 MED ORDER — DEXAMETHASONE SODIUM PHOSPHATE 10 MG/ML IJ SOLN
10.0000 mg | Freq: Once | INTRAMUSCULAR | Status: AC
  Administered 2011-08-13: 10 mg via INTRAVENOUS

## 2011-08-13 MED ORDER — SODIUM CHLORIDE 0.9 % IJ SOLN
10.0000 mL | INTRAMUSCULAR | Status: DC | PRN
  Filled 2011-08-13: qty 10

## 2011-08-13 MED ORDER — PALONOSETRON HCL INJECTION 0.25 MG/5ML
0.2500 mg | Freq: Once | INTRAVENOUS | Status: AC
  Administered 2011-08-13: 0.25 mg via INTRAVENOUS

## 2011-08-13 NOTE — Patient Instructions (Signed)
Pt  Tolerated chemo without problems.   Pt aware of next appt for chemo pump d/c.

## 2011-08-13 NOTE — Telephone Encounter (Signed)
Pt given appt schedule for dec/jan while in infusion.

## 2011-08-13 NOTE — Progress Notes (Signed)
OFFICE PROGRESS NOTE  Interval history:  Derek Crosby returns as scheduled. He completed cycle 6 FOLFOX 07/30/2011. Oxaliplatin was held from cycle number 6 secondary to thrombocytopenia. He tolerated the chemotherapy well. He denies nausea and neuropathy symptoms. He developed skin irritation from the Port-A-Cath dressing. He had pain at the right upper lateral chest during the 5-FU bolus. He was referred to radiology and a diastolic the was negative.   Objective: Blood pressure 135/82, pulse 74, temperature 97.3 F (36.3 C), temperature source Oral, height 5' 10.5" (1.791 m), weight 185 lb 4.8 oz (84.052 kg).  Oropharynx is without thrush or ulceration. Lungs are clear. No wheezes or rales. Regular cardiac rhythm. Port-A-Cath site is nontender without erythema. Abdomen is soft and nontender. No hepatomegaly. Extremities are without edema. Calves are soft and nontender. Vibratory sense is intact over the fingertips by tuning fork exam.   Lab Results: Hemolymph 14.8, platelet is 101,000, white count 3.6, ANC 1.7    Medications: I have reviewed the patient's current medications.  Assessment/Plan: 1. 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  Microsatellite instability testing by immunohistochemistry confirmed a microsatellite stable tumor.  He began adjuvant FOLFOX chemotherapy 05/21/2011.  He completed cycle #6 on 07/30/2011.  Oxaliplatin was held with cycle #6 due to thrombocytopenia 2. Delayed nausea, improved with the addition of Aloxi. 3. Status post Port-A-Cath placement 05/20/2011. 4. 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 5. Kidney stone in 2009. 6. L5-S1 disk surgery in 1997. 7. Low heart rate, likely due to his young age and physical conditioning. 8. Thrombocytopenia secondary to chemotherapy.  He knows to contact us for spontaneous bruising or bleeding. The  thrombocytopenia is partially improved today 9. Abnormal sensation with swallowing, likely related to oxaliplatin. Persistent. 10. Prolonged cold sensitivity following chemotherapy, secondary to oxaliplatin neuropathy. 11. Hyperpigmented slightly raised skin lesions at the posterior neck.  Likely a benign finding, potentially exacerbated by chemotherapy. Disposition-Mr. Zurn will complete cycle #7 of adjuvant FOLFOX chemotherapy today. Oxaliplatin will be given with chemotherapy today. We decided to dose reduce the oxaliplatin by 20% secondary to thrombocytopenia. He will return for an office visit and chemotherapy in 2 weeks. He will alert the chemotherapy nurse if he develops chest discomfort during the chemotherapy infusion today. Plan reviewed with Dr. Truett Perna.  Jacarri Gesner BRADLEY ANP/GNP-BC

## 2011-08-15 ENCOUNTER — Ambulatory Visit (HOSPITAL_BASED_OUTPATIENT_CLINIC_OR_DEPARTMENT_OTHER): Payer: BC Managed Care – PPO

## 2011-08-15 VITALS — BP 139/83 | HR 85

## 2011-08-15 DIAGNOSIS — C187 Malignant neoplasm of sigmoid colon: Secondary | ICD-10-CM

## 2011-08-15 DIAGNOSIS — Z452 Encounter for adjustment and management of vascular access device: Secondary | ICD-10-CM

## 2011-08-15 DIAGNOSIS — C189 Malignant neoplasm of colon, unspecified: Secondary | ICD-10-CM

## 2011-08-15 MED ORDER — SODIUM CHLORIDE 0.9 % IJ SOLN
10.0000 mL | INTRAMUSCULAR | Status: DC | PRN
  Administered 2011-08-15: 10 mL
  Filled 2011-08-15: qty 10

## 2011-08-15 MED ORDER — HEPARIN SOD (PORK) LOCK FLUSH 100 UNIT/ML IV SOLN
500.0000 [IU] | Freq: Once | INTRAVENOUS | Status: AC | PRN
  Administered 2011-08-15: 500 [IU]
  Filled 2011-08-15: qty 5

## 2011-08-26 ENCOUNTER — Other Ambulatory Visit: Payer: Self-pay | Admitting: Oncology

## 2011-08-27 ENCOUNTER — Ambulatory Visit (HOSPITAL_BASED_OUTPATIENT_CLINIC_OR_DEPARTMENT_OTHER): Payer: BC Managed Care – PPO | Admitting: Oncology

## 2011-08-27 ENCOUNTER — Ambulatory Visit (HOSPITAL_BASED_OUTPATIENT_CLINIC_OR_DEPARTMENT_OTHER): Payer: BC Managed Care – PPO

## 2011-08-27 ENCOUNTER — Other Ambulatory Visit (HOSPITAL_BASED_OUTPATIENT_CLINIC_OR_DEPARTMENT_OTHER): Payer: BC Managed Care – PPO

## 2011-08-27 VITALS — BP 143/81 | HR 72 | Temp 97.3°F | Ht 70.5 in | Wt 187.8 lb

## 2011-08-27 DIAGNOSIS — Z5111 Encounter for antineoplastic chemotherapy: Secondary | ICD-10-CM

## 2011-08-27 DIAGNOSIS — R1310 Dysphagia, unspecified: Secondary | ICD-10-CM

## 2011-08-27 DIAGNOSIS — C187 Malignant neoplasm of sigmoid colon: Secondary | ICD-10-CM

## 2011-08-27 DIAGNOSIS — C189 Malignant neoplasm of colon, unspecified: Secondary | ICD-10-CM

## 2011-08-27 DIAGNOSIS — I498 Other specified cardiac arrhythmias: Secondary | ICD-10-CM

## 2011-08-27 DIAGNOSIS — L989 Disorder of the skin and subcutaneous tissue, unspecified: Secondary | ICD-10-CM

## 2011-08-27 LAB — CBC WITH DIFFERENTIAL/PLATELET
Eosinophils Absolute: 0 10*3/uL (ref 0.0–0.5)
MONO#: 0.3 10*3/uL (ref 0.1–0.9)
NEUT#: 2.2 10*3/uL (ref 1.5–6.5)
Platelets: 97 10*3/uL — ABNORMAL LOW (ref 140–400)
RBC: 4.42 10*6/uL (ref 4.20–5.82)
RDW: 14.8 % — ABNORMAL HIGH (ref 11.0–14.6)
WBC: 4 10*3/uL (ref 4.0–10.3)
lymph#: 1.4 10*3/uL (ref 0.9–3.3)
nRBC: 0 % (ref 0–0)

## 2011-08-27 LAB — COMPREHENSIVE METABOLIC PANEL
ALT: 57 U/L — ABNORMAL HIGH (ref 0–53)
CO2: 25 mEq/L (ref 19–32)
Calcium: 9.4 mg/dL (ref 8.4–10.5)
Chloride: 106 mEq/L (ref 96–112)
Potassium: 4.1 mEq/L (ref 3.5–5.3)
Sodium: 140 mEq/L (ref 135–145)
Total Protein: 6.7 g/dL (ref 6.0–8.3)

## 2011-08-27 MED ORDER — SODIUM CHLORIDE 0.9 % IJ SOLN
10.0000 mL | INTRAMUSCULAR | Status: DC | PRN
  Filled 2011-08-27: qty 10

## 2011-08-27 MED ORDER — SODIUM CHLORIDE 0.9 % IV SOLN
2375.0000 mg/m2 | INTRAVENOUS | Status: DC
  Administered 2011-08-27: 4800 mg via INTRAVENOUS
  Filled 2011-08-27: qty 96

## 2011-08-27 MED ORDER — DEXAMETHASONE SODIUM PHOSPHATE 10 MG/ML IJ SOLN
10.0000 mg | Freq: Once | INTRAMUSCULAR | Status: AC
  Administered 2011-08-27: 10 mg via INTRAVENOUS

## 2011-08-27 MED ORDER — FLUOROURACIL CHEMO INJECTION 2.5 GM/50ML
400.0000 mg/m2 | Freq: Once | INTRAVENOUS | Status: AC
  Administered 2011-08-27: 800 mg via INTRAVENOUS
  Filled 2011-08-27: qty 16

## 2011-08-27 MED ORDER — DEXTROSE 5 % IV SOLN
Freq: Once | INTRAVENOUS | Status: AC
  Administered 2011-08-27: 11:00:00 via INTRAVENOUS

## 2011-08-27 MED ORDER — PALONOSETRON HCL INJECTION 0.25 MG/5ML
0.2500 mg | Freq: Once | INTRAVENOUS | Status: AC
  Administered 2011-08-27: 0.25 mg via INTRAVENOUS

## 2011-08-27 MED ORDER — HEPARIN SOD (PORK) LOCK FLUSH 100 UNIT/ML IV SOLN
500.0000 [IU] | Freq: Once | INTRAVENOUS | Status: DC | PRN
  Filled 2011-08-27: qty 5

## 2011-08-27 MED ORDER — LEUCOVORIN CALCIUM INJECTION 350 MG
395.0000 mg/m2 | Freq: Once | INTRAVENOUS | Status: AC
  Administered 2011-08-27: 798 mg via INTRAVENOUS
  Filled 2011-08-27: qty 39.9

## 2011-08-27 MED ORDER — DEXTROSE 5 % IV SOLN
68.0000 mg/m2 | Freq: Once | INTRAVENOUS | Status: AC
  Administered 2011-08-27: 135 mg via INTRAVENOUS
  Filled 2011-08-27: qty 27

## 2011-08-27 NOTE — Progress Notes (Signed)
Verbal consent given  By Dr. Alcide Evener  To give chemo with plalet counts of 97

## 2011-08-27 NOTE — Progress Notes (Signed)
OFFICE PROGRESS NOTE  Interval history:  Derek Crosby returns as scheduled. He completed cycle 7 FOLFOX 08/14/2011.  He tolerated the chemotherapy well. He reports mild nausea on day 4 following chemotherapy. He has developed mild numbness in the fingers and mild tingling in the feet. This does not interfere with activity. He did not have pain at the Port-A-Cath site with the most recent cycle of chemotherapy.   Objective: Blood pressure 143/81, pulse 72, temperature 97.3 F (36.3 C), temperature source Oral, height 5' 10.5" (1.791 m), weight 187 lb 12.8 oz (85.186 kg).  Oropharynx is without thrush or ulceration. Lungs are clear. No wheezes or rales. Regular cardiac rhythm. Port-A-Cath site is nontender without erythema. Abdomen is soft and nontender. No hepatomegaly. Extremities are without edema. Vibratory sense is intact over the fingertips by tuning fork exam.   Lab Results:   Hemoglobin 14.4, platelets 97,000, white count 4.0, ANC 2.2   Medications: I have reviewed the patient's current medications.  Assessment/Plan: 1. 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  Microsatellite instability testing by immunohistochemistry confirmed a microsatellite stable tumor.  He began adjuvant FOLFOX chemotherapy 05/21/2011.  He completed cycle #7 on 08/14/2011.  Oxaliplatin was held with cycle #6 due to thrombocytopenia. The oxaliplatin was dose reduced by 20% with cycle #7. 2. Delayed nausea, improved with the addition of Aloxi. 3. Status post Port-A-Cath placement 05/20/2011. 4. 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 5. Kidney stone in 2009. 6. L5-S1 disk surgery in 1997. 7. Low heart rate, likely due to his young age and physical conditioning. 8. Thrombocytopenia secondary to chemotherapy.  9. Abnormal sensation with swallowing, likely related to oxaliplatin.  Persistent. 10. Prolonged cold sensitivity following chemotherapy, secondary to oxaliplatin neuropathy. 11. Hyperpigmented slightly raised skin lesions at the posterior neck.  Likely a benign finding, potentially exacerbated by chemotherapy. Disposition-Derek Crosby will complete cycle #8 of adjuvant FOLFOX chemotherapy today. Oxaliplatin will be given with chemotherapy today.  He will return for an office visit and chemotherapy on December 31.  Chandra Asher BRADLEY ANP/GNP-BC

## 2011-08-29 ENCOUNTER — Ambulatory Visit: Payer: BC Managed Care – PPO

## 2011-08-29 VITALS — BP 148/83 | HR 60 | Temp 97.5°F

## 2011-08-29 DIAGNOSIS — C189 Malignant neoplasm of colon, unspecified: Secondary | ICD-10-CM

## 2011-08-29 MED ORDER — SODIUM CHLORIDE 0.9 % IJ SOLN
10.0000 mL | INTRAMUSCULAR | Status: DC | PRN
  Administered 2011-08-29: 10 mL
  Filled 2011-08-29: qty 10

## 2011-08-29 MED ORDER — HEPARIN SOD (PORK) LOCK FLUSH 100 UNIT/ML IV SOLN
500.0000 [IU] | Freq: Once | INTRAVENOUS | Status: AC | PRN
  Administered 2011-08-29: 500 [IU]
  Filled 2011-08-29: qty 5

## 2011-09-01 ENCOUNTER — Telehealth: Payer: Self-pay | Admitting: Oncology

## 2011-09-01 ENCOUNTER — Other Ambulatory Visit: Payer: Self-pay | Admitting: Certified Registered Nurse Anesthetist

## 2011-09-01 NOTE — Telephone Encounter (Signed)
S/w the pt and he is aware of the r/s appts for dec and jan 2013 and to pick up an appt calendar.

## 2011-09-10 ENCOUNTER — Ambulatory Visit: Payer: BC Managed Care – PPO

## 2011-09-10 ENCOUNTER — Ambulatory Visit: Payer: BC Managed Care – PPO | Admitting: Oncology

## 2011-09-10 ENCOUNTER — Other Ambulatory Visit: Payer: BC Managed Care – PPO | Admitting: Lab

## 2011-09-12 ENCOUNTER — Other Ambulatory Visit: Payer: Self-pay | Admitting: Oncology

## 2011-09-14 ENCOUNTER — Ambulatory Visit (HOSPITAL_BASED_OUTPATIENT_CLINIC_OR_DEPARTMENT_OTHER): Payer: BC Managed Care – PPO | Admitting: Nurse Practitioner

## 2011-09-14 ENCOUNTER — Ambulatory Visit: Payer: BC Managed Care – PPO | Admitting: Nurse Practitioner

## 2011-09-14 ENCOUNTER — Other Ambulatory Visit (HOSPITAL_BASED_OUTPATIENT_CLINIC_OR_DEPARTMENT_OTHER): Payer: BC Managed Care – PPO | Admitting: Lab

## 2011-09-14 VITALS — BP 135/82 | HR 68 | Temp 97.3°F | Ht 70.5 in | Wt 188.4 lb

## 2011-09-14 DIAGNOSIS — D6959 Other secondary thrombocytopenia: Secondary | ICD-10-CM

## 2011-09-14 DIAGNOSIS — C189 Malignant neoplasm of colon, unspecified: Secondary | ICD-10-CM

## 2011-09-14 DIAGNOSIS — Z79899 Other long term (current) drug therapy: Secondary | ICD-10-CM

## 2011-09-14 DIAGNOSIS — C187 Malignant neoplasm of sigmoid colon: Secondary | ICD-10-CM

## 2011-09-14 DIAGNOSIS — K5900 Constipation, unspecified: Secondary | ICD-10-CM

## 2011-09-14 LAB — COMPREHENSIVE METABOLIC PANEL
Alkaline Phosphatase: 72 U/L (ref 39–117)
BUN: 17 mg/dL (ref 6–23)
Glucose, Bld: 103 mg/dL — ABNORMAL HIGH (ref 70–99)
Total Bilirubin: 0.6 mg/dL (ref 0.3–1.2)

## 2011-09-14 LAB — CBC WITH DIFFERENTIAL/PLATELET
Basophils Absolute: 0 10*3/uL (ref 0.0–0.1)
Eosinophils Absolute: 0 10*3/uL (ref 0.0–0.5)
HGB: 15.4 g/dL (ref 13.0–17.1)
LYMPH%: 36.1 % (ref 14.0–49.0)
MCV: 95.5 fL (ref 79.3–98.0)
MONO%: 15.6 % — ABNORMAL HIGH (ref 0.0–14.0)
NEUT#: 1.2 10*3/uL — ABNORMAL LOW (ref 1.5–6.5)
Platelets: 128 10*3/uL — ABNORMAL LOW (ref 140–400)
RBC: 4.44 10*6/uL (ref 4.20–5.82)

## 2011-09-14 NOTE — Progress Notes (Signed)
OFFICE PROGRESS NOTE  Interval history:  Mr. Quezada returns as scheduled. He completed cycle 8 adjuvant FOLFOX 08/27/2011.  Mr. Bruna denies nausea or vomiting associated with the chemotherapy. No mouth sores. No diarrhea. He has mild numbness in the fingertips and toes. The numbness does not interfere with activity.  Over the past 2 weeks he reports 3 episodes of bright red blood on the toilet tissue following a "hard stool", constipation and straining.   Objective: Blood pressure 135/82, pulse 68, temperature 97.3 F (36.3 C), temperature source Oral, height 5' 10.5" (1.791 m), weight 188 lb 6.4 oz (85.458 kg).  Oropharynx is without thrush or ulceration. Lungs are clear. No wheezes or rales. Regular cardiac rhythm. Port-A-Cath site is without erythema. Abdomen is soft and nontender. No hepatomegaly. Extremities are without edema. Vibratory sense is intact over the fingertips per tuning fork exam.   Lab Results: Lab Results  Component Value Date   WBC 2.7* 09/14/2011   HGB 15.4 09/14/2011   HCT 42.4 09/14/2011   MCV 95.5 09/14/2011   PLT 128* 09/14/2011    Chemistry:    Chemistry      Component Value Date/Time   NA 140 08/27/2011 0906   K 4.1 08/27/2011 0906   CL 106 08/27/2011 0906   CO2 25 08/27/2011 0906   BUN 15 08/27/2011 0906   CREATININE 0.94 08/27/2011 0906      Component Value Date/Time   CALCIUM 9.4 08/27/2011 0906   ALKPHOS 62 08/27/2011 0906   AST 48* 08/27/2011 0906   ALT 57* 08/27/2011 0906   BILITOT 0.6 08/27/2011 0906       Studies/Results: No results found.  Medications: I have reviewed the patient's current medications.  Assessment/Plan:  1. 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 Microsatellite instability testing by immunohistochemistry confirmed a microsatellite stable tumor. He began adjuvant FOLFOX chemotherapy 05/21/2011. He completed cycle #7 on 08/14/2011.  Oxaliplatin was held with cycle #6 due to thrombocytopenia. The oxaliplatin was dose reduced by 20% with cycle #7. He completed cycle #8 on 08/27/2011. 2. Delayed nausea, improved with the addition of Aloxi. 3. Status post Port-A-Cath placement 05/20/2011. 4. 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 5. Kidney stone in 2009. 6. L5-S1 disk surgery in 1997. 7. Low heart rate, likely due to his young age and physical conditioning. 8. Thrombocytopenia secondary to chemotherapy.  9. Abnormal sensation with swallowing, likely related to oxaliplatin. Persistent. 10. Prolonged cold sensitivity following chemotherapy, secondary to oxaliplatin neuropathy. 11. Hyperpigmented slightly raised skin lesions at the posterior neck. Likely a benign finding, potentially exacerbated by chemotherapy. 12. Rectal bleeding secondary to constipation, straining-he will begin a stool softener and laxative. 13. Mild neutropenia secondary to chemotherapy.  Disposition-Mr. Arabie appears stable. He is scheduled to return for cycle 9 adjuvant FOLFOX 09/16/2011. He is mildly neutropenic on labs today. We will repeat a CBC prior to treatment 09/16/2011. If the neutrophil count is stable to improved we will proceed with treatment as scheduled and give Neulasta on the day of pump discontinuation. If the neutrophil count is lower we will hold treatment and reschedule for one week. He will return for a followup visit and chemotherapy on 10/01/2011. He will contact the office in the interim with any problems.   Lonna Cobb ANP/GNP-BC

## 2011-09-16 ENCOUNTER — Telehealth: Payer: Self-pay | Admitting: *Deleted

## 2011-09-16 ENCOUNTER — Other Ambulatory Visit (HOSPITAL_BASED_OUTPATIENT_CLINIC_OR_DEPARTMENT_OTHER): Payer: BC Managed Care – PPO | Admitting: Lab

## 2011-09-16 ENCOUNTER — Ambulatory Visit: Payer: BC Managed Care – PPO

## 2011-09-16 DIAGNOSIS — C189 Malignant neoplasm of colon, unspecified: Secondary | ICD-10-CM

## 2011-09-16 LAB — CBC WITH DIFFERENTIAL/PLATELET
Basophils Absolute: 0 10*3/uL (ref 0.0–0.1)
Eosinophils Absolute: 0.1 10*3/uL (ref 0.0–0.5)
HGB: 15.2 g/dL (ref 13.0–17.1)
LYMPH%: 54.9 % — ABNORMAL HIGH (ref 14.0–49.0)
MCV: 91.3 fL (ref 79.3–98.0)
MONO%: 15.5 % — ABNORMAL HIGH (ref 0.0–14.0)
NEUT#: 0.8 10*3/uL — ABNORMAL LOW (ref 1.5–6.5)
Platelets: 126 10*3/uL — ABNORMAL LOW (ref 140–400)
RDW: 13.8 % (ref 11.0–14.6)

## 2011-09-16 NOTE — Progress Notes (Signed)
0825 Reported CBC results to Dr. Cyndie Chime, ordered to hold treatment today.

## 2011-09-23 ENCOUNTER — Inpatient Hospital Stay: Payer: BC Managed Care – PPO

## 2011-09-24 ENCOUNTER — Ambulatory Visit: Payer: BC Managed Care – PPO | Admitting: Oncology

## 2011-09-24 ENCOUNTER — Other Ambulatory Visit: Payer: BC Managed Care – PPO | Admitting: Lab

## 2011-09-24 ENCOUNTER — Other Ambulatory Visit: Payer: BC Managed Care – PPO

## 2011-09-24 ENCOUNTER — Ambulatory Visit: Payer: BC Managed Care – PPO

## 2011-09-24 ENCOUNTER — Telehealth: Payer: Self-pay | Admitting: Oncology

## 2011-09-24 ENCOUNTER — Ambulatory Visit (HOSPITAL_BASED_OUTPATIENT_CLINIC_OR_DEPARTMENT_OTHER): Payer: BC Managed Care – PPO

## 2011-09-24 VITALS — BP 148/85 | HR 73 | Temp 97.8°F

## 2011-09-24 DIAGNOSIS — Z5111 Encounter for antineoplastic chemotherapy: Secondary | ICD-10-CM

## 2011-09-24 DIAGNOSIS — C189 Malignant neoplasm of colon, unspecified: Secondary | ICD-10-CM

## 2011-09-24 LAB — CBC WITH DIFFERENTIAL/PLATELET
BASO%: 0.4 % (ref 0.0–2.0)
Basophils Absolute: 0 10*3/uL (ref 0.0–0.1)
HCT: 43.4 % (ref 38.4–49.9)
LYMPH%: 30.3 % (ref 14.0–49.0)
MCHC: 36.6 g/dL — ABNORMAL HIGH (ref 32.0–36.0)
MONO#: 0.4 10*3/uL (ref 0.1–0.9)
NEUT%: 61.3 % (ref 39.0–75.0)
Platelets: 131 10*3/uL — ABNORMAL LOW (ref 140–400)
WBC: 4.9 10*3/uL (ref 4.0–10.3)

## 2011-09-24 LAB — COMPREHENSIVE METABOLIC PANEL
ALT: 29 U/L (ref 0–53)
AST: 29 U/L (ref 0–37)
Chloride: 102 mEq/L (ref 96–112)
Creatinine, Ser: 0.99 mg/dL (ref 0.50–1.35)
Total Bilirubin: 0.5 mg/dL (ref 0.3–1.2)

## 2011-09-24 MED ORDER — DEXTROSE 5 % IV SOLN
Freq: Once | INTRAVENOUS | Status: AC
  Administered 2011-09-24: 10:00:00 via INTRAVENOUS

## 2011-09-24 MED ORDER — HEPARIN SOD (PORK) LOCK FLUSH 100 UNIT/ML IV SOLN
500.0000 [IU] | Freq: Once | INTRAVENOUS | Status: DC | PRN
  Filled 2011-09-24: qty 5

## 2011-09-24 MED ORDER — SODIUM CHLORIDE 0.9 % IV SOLN
2375.0000 mg/m2 | INTRAVENOUS | Status: DC
  Administered 2011-09-24: 4800 mg via INTRAVENOUS
  Filled 2011-09-24: qty 96

## 2011-09-24 MED ORDER — DEXAMETHASONE SODIUM PHOSPHATE 10 MG/ML IJ SOLN
10.0000 mg | Freq: Once | INTRAMUSCULAR | Status: AC
  Administered 2011-09-24: 10 mg via INTRAVENOUS

## 2011-09-24 MED ORDER — OXALIPLATIN CHEMO INJECTION 100 MG/20ML
68.0000 mg/m2 | Freq: Once | INTRAVENOUS | Status: AC
  Administered 2011-09-24: 135 mg via INTRAVENOUS
  Filled 2011-09-24: qty 27

## 2011-09-24 MED ORDER — SODIUM CHLORIDE 0.9 % IJ SOLN
10.0000 mL | INTRAMUSCULAR | Status: DC | PRN
  Filled 2011-09-24: qty 10

## 2011-09-24 MED ORDER — LEUCOVORIN CALCIUM INJECTION 350 MG
395.0000 mg/m2 | Freq: Once | INTRAVENOUS | Status: AC
  Administered 2011-09-24: 798 mg via INTRAVENOUS
  Filled 2011-09-24: qty 39.9

## 2011-09-24 MED ORDER — FLUOROURACIL CHEMO INJECTION 2.5 GM/50ML
400.0000 mg/m2 | Freq: Once | INTRAVENOUS | Status: AC
  Administered 2011-09-24: 800 mg via INTRAVENOUS
  Filled 2011-09-24: qty 16

## 2011-09-24 MED ORDER — PALONOSETRON HCL INJECTION 0.25 MG/5ML
0.2500 mg | Freq: Once | INTRAVENOUS | Status: AC
  Administered 2011-09-24: 0.25 mg via INTRAVENOUS

## 2011-09-26 ENCOUNTER — Ambulatory Visit (HOSPITAL_BASED_OUTPATIENT_CLINIC_OR_DEPARTMENT_OTHER): Payer: BC Managed Care – PPO

## 2011-09-26 VITALS — BP 141/79 | HR 59 | Temp 97.5°F

## 2011-09-26 DIAGNOSIS — C187 Malignant neoplasm of sigmoid colon: Secondary | ICD-10-CM

## 2011-09-26 DIAGNOSIS — C189 Malignant neoplasm of colon, unspecified: Secondary | ICD-10-CM

## 2011-09-26 MED ORDER — HEPARIN SOD (PORK) LOCK FLUSH 100 UNIT/ML IV SOLN
500.0000 [IU] | Freq: Once | INTRAVENOUS | Status: AC | PRN
  Administered 2011-09-26: 500 [IU]
  Filled 2011-09-26: qty 5

## 2011-09-26 MED ORDER — SODIUM CHLORIDE 0.9 % IJ SOLN
10.0000 mL | INTRAMUSCULAR | Status: DC | PRN
  Administered 2011-09-26: 10 mL
  Filled 2011-09-26: qty 10

## 2011-09-28 NOTE — Telephone Encounter (Signed)
na

## 2011-09-30 ENCOUNTER — Other Ambulatory Visit: Payer: Self-pay | Admitting: Oncology

## 2011-10-01 ENCOUNTER — Inpatient Hospital Stay: Payer: BC Managed Care – PPO

## 2011-10-01 ENCOUNTER — Other Ambulatory Visit (HOSPITAL_BASED_OUTPATIENT_CLINIC_OR_DEPARTMENT_OTHER): Payer: BC Managed Care – PPO | Admitting: Lab

## 2011-10-01 ENCOUNTER — Ambulatory Visit (HOSPITAL_BASED_OUTPATIENT_CLINIC_OR_DEPARTMENT_OTHER): Payer: BC Managed Care – PPO | Admitting: Oncology

## 2011-10-01 VITALS — BP 130/83 | HR 83 | Temp 97.4°F | Ht 70.5 in | Wt 190.7 lb

## 2011-10-01 DIAGNOSIS — D696 Thrombocytopenia, unspecified: Secondary | ICD-10-CM

## 2011-10-01 DIAGNOSIS — C189 Malignant neoplasm of colon, unspecified: Secondary | ICD-10-CM

## 2011-10-01 DIAGNOSIS — R6884 Jaw pain: Secondary | ICD-10-CM

## 2011-10-01 DIAGNOSIS — C187 Malignant neoplasm of sigmoid colon: Secondary | ICD-10-CM

## 2011-10-01 LAB — COMPREHENSIVE METABOLIC PANEL
Albumin: 3.8 g/dL (ref 3.5–5.2)
BUN: 16 mg/dL (ref 6–23)
CO2: 27 mEq/L (ref 19–32)
Calcium: 9.9 mg/dL (ref 8.4–10.5)
Chloride: 101 mEq/L (ref 96–112)
Creatinine, Ser: 1.02 mg/dL (ref 0.50–1.35)
Glucose, Bld: 95 mg/dL (ref 70–99)
Potassium: 4.1 mEq/L (ref 3.5–5.3)

## 2011-10-01 LAB — CBC WITH DIFFERENTIAL/PLATELET
Basophils Absolute: 0 10*3/uL (ref 0.0–0.1)
Eosinophils Absolute: 0 10*3/uL (ref 0.0–0.5)
HCT: 42.7 % (ref 38.4–49.9)
HGB: 15.4 g/dL (ref 13.0–17.1)
MCH: 33.9 pg — ABNORMAL HIGH (ref 27.2–33.4)
MONO#: 0.3 10*3/uL (ref 0.1–0.9)
NEUT#: 3.2 10*3/uL (ref 1.5–6.5)
NEUT%: 60.4 % (ref 39.0–75.0)
WBC: 5.2 10*3/uL (ref 4.0–10.3)
lymph#: 1.7 10*3/uL (ref 0.9–3.3)

## 2011-10-01 NOTE — Progress Notes (Signed)
OFFICE PROGRESS NOTE   INTERVAL HISTORY:   He returns as scheduled. Chemotherapy was held on January 2 due to neutropenia. He was treated with chemotherapy on January 10. He reports tolerating the chemotherapy well. He denies mouth sores and nausea/vomiting following chemotherapy. He reports intermittent "tingling" in the fingers and toes. He reports a mild loss of sensation at the fingers. This does not interfere with function. He has noted an improved energy level with the last few cycles of chemotherapy.  Objective:  Vital signs in last 24 hours:  Blood pressure 130/83, pulse 83, temperature 97.4 F (36.3 C), temperature source Oral, height 5' 10.5" (1.791 m), weight 190 lb 11.2 oz (86.501 kg).    HEENT: No thrush or ulcers Resp: Lungs clear bilaterally Cardio: Regular rate and rhythm GI: No hepatomegaly Vascular: No leg edema Neuro: The vibratory sense is intact at the fingertips bilaterally    Portacath/PICC-without erythema  Lab Results:  Lab Results  Component Value Date   WBC 5.2 10/01/2011   HGB 15.4 10/01/2011   HCT 42.7 10/01/2011   MCV 94.3 10/01/2011   PLT 131* 10/01/2011   ANC 3.2   Medications: I have reviewed the patient's current medications.  Assessment/Plan: 1. 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 Microsatellite instability testing by immunohistochemistry confirmed a microsatellite stable tumor. He began adjuvant FOLFOX chemotherapy 05/21/2011. He completed cycle #7 on 08/14/2011. Oxaliplatin was held with cycle #6 due to thrombocytopenia. The oxaliplatin was dose reduced by 20% with cycle #7. He completed cycle #9 on 09/24/2011. 2. Delayed nausea, improved with the addition of Aloxi. 3. Status post Port-A-Cath placement 05/20/2011. 4. 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 5. Kidney stone in 2009. 6. L5-S1 disk surgery in  1997. 7. Low heart rate, likely due to his young age and physical conditioning. 8. Thrombocytopenia secondary to chemotherapy-stable 9. Abnormal sensation with swallowing, likely related to oxaliplatin. Persistent. 10. 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 This does not interfere with function at present.. 11. Mild neutropenia secondary to chemotherapy-the neutrophil count had recovered when he received cycle #9 on January 10. The neutrophil count is normal today. We discussed Neulasta therapy. He does not want to receive Neulasta.     Disposition:  He appears stable. He'll return for cycle #10 of adjuvant FOLFOX chemotherapy on 10/08/2011. He will return for an office visit and chemotherapy on 10/22/2011.   Lucile Shutters, MD  10/01/2011  1:56 PM

## 2011-10-02 ENCOUNTER — Telehealth: Payer: Self-pay | Admitting: Oncology

## 2011-10-02 NOTE — Telephone Encounter (Signed)
called pt lmovm for appts fro jan-feb2013 and to rtn call to me to confirm

## 2011-10-08 ENCOUNTER — Telehealth: Payer: Self-pay | Admitting: Oncology

## 2011-10-08 ENCOUNTER — Other Ambulatory Visit: Payer: BC Managed Care – PPO | Admitting: Lab

## 2011-10-08 ENCOUNTER — Ambulatory Visit (HOSPITAL_BASED_OUTPATIENT_CLINIC_OR_DEPARTMENT_OTHER): Payer: BC Managed Care – PPO

## 2011-10-08 VITALS — BP 137/87 | HR 77 | Temp 97.8°F

## 2011-10-08 DIAGNOSIS — C189 Malignant neoplasm of colon, unspecified: Secondary | ICD-10-CM

## 2011-10-08 DIAGNOSIS — Z5111 Encounter for antineoplastic chemotherapy: Secondary | ICD-10-CM

## 2011-10-08 LAB — CBC WITH DIFFERENTIAL/PLATELET
BASO%: 0.5 % (ref 0.0–2.0)
Basophils Absolute: 0 10*3/uL (ref 0.0–0.1)
HCT: 40.6 % (ref 38.4–49.9)
HGB: 14.8 g/dL (ref 13.0–17.1)
MONO#: 0.4 10*3/uL (ref 0.1–0.9)
NEUT#: 2.6 10*3/uL (ref 1.5–6.5)
NEUT%: 59 % (ref 39.0–75.0)
WBC: 4.4 10*3/uL (ref 4.0–10.3)
lymph#: 1.3 10*3/uL (ref 0.9–3.3)

## 2011-10-08 MED ORDER — PALONOSETRON HCL INJECTION 0.25 MG/5ML
0.2500 mg | Freq: Once | INTRAVENOUS | Status: AC
  Administered 2011-10-08: 0.25 mg via INTRAVENOUS

## 2011-10-08 MED ORDER — DEXAMETHASONE SODIUM PHOSPHATE 10 MG/ML IJ SOLN
10.0000 mg | Freq: Once | INTRAMUSCULAR | Status: AC
  Administered 2011-10-08: 10 mg via INTRAVENOUS

## 2011-10-08 MED ORDER — DEXTROSE 5 % IV SOLN
Freq: Once | INTRAVENOUS | Status: AC
  Administered 2011-10-08: 09:00:00 via INTRAVENOUS

## 2011-10-08 MED ORDER — OXALIPLATIN CHEMO INJECTION 100 MG/20ML
68.0000 mg/m2 | Freq: Once | INTRAVENOUS | Status: AC
  Administered 2011-10-08: 135 mg via INTRAVENOUS
  Filled 2011-10-08: qty 27

## 2011-10-08 MED ORDER — LEUCOVORIN CALCIUM INJECTION 350 MG
395.0000 mg/m2 | Freq: Once | INTRAMUSCULAR | Status: AC
  Administered 2011-10-08: 798 mg via INTRAVENOUS
  Filled 2011-10-08: qty 39.9

## 2011-10-08 MED ORDER — SODIUM CHLORIDE 0.9 % IJ SOLN
10.0000 mL | INTRAMUSCULAR | Status: DC | PRN
  Filled 2011-10-08: qty 10

## 2011-10-08 MED ORDER — FLUOROURACIL CHEMO INJECTION 2.5 GM/50ML
400.0000 mg/m2 | Freq: Once | INTRAVENOUS | Status: AC
  Administered 2011-10-08: 800 mg via INTRAVENOUS
  Filled 2011-10-08: qty 16

## 2011-10-08 MED ORDER — HEPARIN SOD (PORK) LOCK FLUSH 100 UNIT/ML IV SOLN
500.0000 [IU] | Freq: Once | INTRAVENOUS | Status: DC | PRN
  Filled 2011-10-08: qty 5

## 2011-10-08 MED ORDER — SODIUM CHLORIDE 0.9 % IV SOLN
2375.0000 mg/m2 | INTRAVENOUS | Status: DC
  Administered 2011-10-08: 4800 mg via INTRAVENOUS
  Filled 2011-10-08: qty 96

## 2011-10-08 NOTE — Patient Instructions (Signed)
Pt aware of future appts, no complaints, ambulatory, SLJ

## 2011-10-08 NOTE — Telephone Encounter (Signed)
Pt dropped by and p/u feb. sch.aom

## 2011-10-10 ENCOUNTER — Ambulatory Visit (HOSPITAL_BASED_OUTPATIENT_CLINIC_OR_DEPARTMENT_OTHER): Payer: BC Managed Care – PPO

## 2011-10-10 VITALS — BP 142/78 | HR 67 | Temp 97.0°F

## 2011-10-10 DIAGNOSIS — C189 Malignant neoplasm of colon, unspecified: Secondary | ICD-10-CM

## 2011-10-10 DIAGNOSIS — T82598A Other mechanical complication of other cardiac and vascular devices and implants, initial encounter: Secondary | ICD-10-CM

## 2011-10-10 MED ORDER — SODIUM CHLORIDE 0.9 % IJ SOLN
10.0000 mL | INTRAMUSCULAR | Status: DC | PRN
  Administered 2011-10-10: 10 mL
  Filled 2011-10-10: qty 10

## 2011-10-10 MED ORDER — HEPARIN SOD (PORK) LOCK FLUSH 100 UNIT/ML IV SOLN
500.0000 [IU] | Freq: Once | INTRAVENOUS | Status: AC | PRN
  Administered 2011-10-10: 500 [IU]
  Filled 2011-10-10: qty 5

## 2011-10-20 ENCOUNTER — Other Ambulatory Visit: Payer: Self-pay | Admitting: Oncology

## 2011-10-22 ENCOUNTER — Ambulatory Visit (HOSPITAL_BASED_OUTPATIENT_CLINIC_OR_DEPARTMENT_OTHER): Payer: BC Managed Care – PPO | Admitting: Oncology

## 2011-10-22 ENCOUNTER — Other Ambulatory Visit (HOSPITAL_BASED_OUTPATIENT_CLINIC_OR_DEPARTMENT_OTHER): Payer: BC Managed Care – PPO | Admitting: Lab

## 2011-10-22 ENCOUNTER — Ambulatory Visit (HOSPITAL_BASED_OUTPATIENT_CLINIC_OR_DEPARTMENT_OTHER): Payer: BC Managed Care – PPO

## 2011-10-22 ENCOUNTER — Telehealth: Payer: Self-pay | Admitting: Oncology

## 2011-10-22 VITALS — BP 146/84 | HR 70 | Temp 97.7°F | Ht 70.0 in | Wt 191.2 lb

## 2011-10-22 VITALS — BP 137/89 | HR 67 | Temp 97.7°F

## 2011-10-22 DIAGNOSIS — C189 Malignant neoplasm of colon, unspecified: Secondary | ICD-10-CM

## 2011-10-22 DIAGNOSIS — D702 Other drug-induced agranulocytosis: Secondary | ICD-10-CM

## 2011-10-22 DIAGNOSIS — Z5111 Encounter for antineoplastic chemotherapy: Secondary | ICD-10-CM

## 2011-10-22 DIAGNOSIS — D6959 Other secondary thrombocytopenia: Secondary | ICD-10-CM

## 2011-10-22 DIAGNOSIS — C187 Malignant neoplasm of sigmoid colon: Secondary | ICD-10-CM

## 2011-10-22 DIAGNOSIS — L500 Allergic urticaria: Secondary | ICD-10-CM

## 2011-10-22 LAB — COMPREHENSIVE METABOLIC PANEL
ALT: 32 U/L (ref 0–53)
AST: 39 U/L — ABNORMAL HIGH (ref 0–37)
Alkaline Phosphatase: 65 U/L (ref 39–117)
CO2: 27 mEq/L (ref 19–32)
Creatinine, Ser: 1 mg/dL (ref 0.50–1.35)
Sodium: 138 mEq/L (ref 135–145)
Total Bilirubin: 0.5 mg/dL (ref 0.3–1.2)
Total Protein: 7.3 g/dL (ref 6.0–8.3)

## 2011-10-22 LAB — CBC WITH DIFFERENTIAL/PLATELET
BASO%: 0.4 % (ref 0.0–2.0)
Eosinophils Absolute: 0.1 10*3/uL (ref 0.0–0.5)
LYMPH%: 40.3 % (ref 14.0–49.0)
MCHC: 36.7 g/dL — ABNORMAL HIGH (ref 32.0–36.0)
MCV: 89.8 fL (ref 79.3–98.0)
MONO%: 8.1 % (ref 0.0–14.0)
NEUT#: 1.4 10*3/uL — ABNORMAL LOW (ref 1.5–6.5)
RBC: 4.61 10*6/uL (ref 4.20–5.82)
RDW: 12.9 % (ref 11.0–14.6)
WBC: 2.8 10*3/uL — ABNORMAL LOW (ref 4.0–10.3)
nRBC: 0 % (ref 0–0)

## 2011-10-22 MED ORDER — PALONOSETRON HCL INJECTION 0.25 MG/5ML
0.2500 mg | Freq: Once | INTRAVENOUS | Status: AC
  Administered 2011-10-22: 0.25 mg via INTRAVENOUS

## 2011-10-22 MED ORDER — DEXAMETHASONE SODIUM PHOSPHATE 10 MG/ML IJ SOLN
10.0000 mg | Freq: Once | INTRAMUSCULAR | Status: AC
  Administered 2011-10-22: 10 mg via INTRAVENOUS

## 2011-10-22 MED ORDER — SODIUM CHLORIDE 0.9 % IV SOLN
2375.0000 mg/m2 | INTRAVENOUS | Status: DC
  Administered 2011-10-22: 4800 mg via INTRAVENOUS
  Filled 2011-10-22: qty 96

## 2011-10-22 MED ORDER — LEUCOVORIN CALCIUM INJECTION 350 MG
395.0000 mg/m2 | Freq: Once | INTRAVENOUS | Status: AC
  Administered 2011-10-22: 798 mg via INTRAVENOUS
  Filled 2011-10-22: qty 39.9

## 2011-10-22 MED ORDER — DIPHENHYDRAMINE HCL 50 MG/ML IJ SOLN
25.0000 mg | Freq: Once | INTRAMUSCULAR | Status: AC
  Administered 2011-10-22: 25 mg via INTRAVENOUS

## 2011-10-22 MED ORDER — OXALIPLATIN CHEMO INJECTION 100 MG/20ML
68.0000 mg/m2 | Freq: Once | INTRAVENOUS | Status: AC
  Administered 2011-10-22: 135 mg via INTRAVENOUS
  Filled 2011-10-22: qty 27

## 2011-10-22 MED ORDER — FLUOROURACIL CHEMO INJECTION 2.5 GM/50ML
400.0000 mg/m2 | Freq: Once | INTRAVENOUS | Status: AC
  Administered 2011-10-22: 800 mg via INTRAVENOUS
  Filled 2011-10-22: qty 16

## 2011-10-22 MED ORDER — DEXTROSE 5 % IV SOLN
Freq: Once | INTRAVENOUS | Status: AC
  Administered 2011-10-22: 10:00:00 via INTRAVENOUS

## 2011-10-22 NOTE — Progress Notes (Signed)
OFFICE PROGRESS NOTE   INTERVAL HISTORY:   He completed another cycle of chemotherapy on 10/08/2011. He developed discomfort at the right upper gum line and buccal mucosa. He was concerned he had a "infected "tooth. He then developed generalized mouth soreness without discrete ulcers. These symptoms have resolved. Cold sensitivity following chemotherapy has improved. He continues to have tingling in the feet and numbness in the fingertips, but these symptoms have also improved compared to the week following chemotherapy. He continues to work.  Objective:  Vital signs in last 24 hours:  Blood pressure 146/84, pulse 70, temperature 97.7 F (36.5 C), temperature source Oral, height 5\' 10"  (1.778 m), weight 191 lb 3.2 oz (86.728 kg).    HEENT: Resolving ulcer at the right upper posterior buccal mucosa. No other ulcers. No thrush. Resp: Lungs clear bilaterally Cardio: Regular rate and rhythm GI: No hepatomegaly, nontender Vascular: No leg edema Neuro: The vibratory sense is intact at the fingertips. There is a mild decrease in vibratory sense at the toes.    Portacath/PICC-without erythema  Lab Results:  Lab Results  Component Value Date   WBC 2.8* 10/22/2011   HGB 15.2 10/22/2011   HCT 41.4 10/22/2011   MCV 89.8 10/22/2011   PLT 89* 10/22/2011   ANC 1.4   Medications: I have reviewed the patient's current medications.  Assessment/Plan: 1. 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 Microsatellite instability testing by immunohistochemistry confirmed a microsatellite stable tumor. He began adjuvant FOLFOX chemotherapy 05/21/2011. He completed cycle #7 on 08/14/2011. Oxaliplatin was held with cycle #6 due to thrombocytopenia. The oxaliplatin was dose reduced by 20% with cycle #7. He completed cycle #10 on 10/08/2011. 2. Delayed nausea, improved with the addition of Aloxi. 3. Status post Port-A-Cath placement  05/20/2011. 4. 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 5. Kidney stone in 2009. 6. L5-S1 disk surgery in 1997. 7. Low heart rate, likely due to his young age and physical conditioning. 8. Thrombocytopenia secondary to chemotherapy-the platelet count is lower today 9. Abnormal sensation with swallowing, likely related to oxaliplatin. Persistent. 10. 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 This does not interfere with function at present.. 11. Mild neutropenia secondary to chemotherapy 12. Mucositis following cycle 10 FOLFOX-resolved   Disposition:  He has completed 10 cycles of adjuvant therapy. The platelet and neutrophil counts are low today. He would like to proceed with therapy. He does not wish to receive Neulasta. He knows to contact us for bleeding or symptoms of infection. He will return for a nadir CBC on February 15th 2013. He is scheduled for an office visit and chemotherapy in 2 weeks. He will contact us if he develops mouth sores following this cycle of chemotherapy.  Addendum: He developed pruritus and "hives "during the oxaliplatin infusion. He otherwise appears stable. The hives resolved following Benadryl. He completed the 5 fluorouracil. Oxaliplatin was discontinued. He appears to have developed an allergy to oxaliplatin. We will discontinue the oxaliplatin with the final planned cycle of chemotherapy.   Lucile Shutters, MD  10/22/2011  5:07 PM

## 2011-10-22 NOTE — Telephone Encounter (Signed)
Gv pt appt for [id number] °

## 2011-10-24 ENCOUNTER — Ambulatory Visit (HOSPITAL_BASED_OUTPATIENT_CLINIC_OR_DEPARTMENT_OTHER): Payer: BC Managed Care – PPO

## 2011-10-24 VITALS — BP 138/87 | HR 55 | Temp 97.2°F

## 2011-10-24 DIAGNOSIS — C189 Malignant neoplasm of colon, unspecified: Secondary | ICD-10-CM

## 2011-10-24 MED ORDER — HEPARIN SOD (PORK) LOCK FLUSH 100 UNIT/ML IV SOLN
500.0000 [IU] | Freq: Once | INTRAVENOUS | Status: AC | PRN
  Administered 2011-10-24: 500 [IU]
  Filled 2011-10-24: qty 5

## 2011-10-24 MED ORDER — SODIUM CHLORIDE 0.9 % IJ SOLN
10.0000 mL | INTRAMUSCULAR | Status: DC | PRN
  Administered 2011-10-24: 10 mL
  Filled 2011-10-24: qty 10

## 2011-10-24 NOTE — Patient Instructions (Signed)
Pt aware of future appts, no complaints.  SLJ 

## 2011-10-30 ENCOUNTER — Other Ambulatory Visit: Payer: BC Managed Care – PPO | Admitting: Lab

## 2011-10-30 ENCOUNTER — Telehealth: Payer: Self-pay | Admitting: *Deleted

## 2011-10-30 DIAGNOSIS — C189 Malignant neoplasm of colon, unspecified: Secondary | ICD-10-CM

## 2011-10-30 LAB — CBC WITH DIFFERENTIAL/PLATELET
Basophils Absolute: 0 10*3/uL (ref 0.0–0.1)
EOS%: 1.5 % (ref 0.0–7.0)
HCT: 43.1 % (ref 38.4–49.9)
HGB: 15.8 g/dL (ref 13.0–17.1)
MCH: 32.8 pg (ref 27.2–33.4)
MONO#: 0.4 10*3/uL (ref 0.1–0.9)
NEUT%: 39 % (ref 39.0–75.0)
lymph#: 1.5 10*3/uL (ref 0.9–3.3)

## 2011-10-30 NOTE — Telephone Encounter (Signed)
Left MV with lab results--good. Follow up as scheduled.

## 2011-11-04 ENCOUNTER — Other Ambulatory Visit: Payer: Self-pay | Admitting: Oncology

## 2011-11-05 ENCOUNTER — Ambulatory Visit (HOSPITAL_BASED_OUTPATIENT_CLINIC_OR_DEPARTMENT_OTHER): Payer: BC Managed Care – PPO

## 2011-11-05 ENCOUNTER — Telehealth: Payer: Self-pay | Admitting: *Deleted

## 2011-11-05 ENCOUNTER — Telehealth: Payer: Self-pay | Admitting: Oncology

## 2011-11-05 ENCOUNTER — Other Ambulatory Visit (HOSPITAL_BASED_OUTPATIENT_CLINIC_OR_DEPARTMENT_OTHER): Payer: BC Managed Care – PPO | Admitting: Lab

## 2011-11-05 ENCOUNTER — Ambulatory Visit (HOSPITAL_BASED_OUTPATIENT_CLINIC_OR_DEPARTMENT_OTHER): Payer: BC Managed Care – PPO | Admitting: Oncology

## 2011-11-05 VITALS — BP 131/84 | HR 72 | Temp 98.2°F | Ht 70.0 in | Wt 192.3 lb

## 2011-11-05 DIAGNOSIS — C187 Malignant neoplasm of sigmoid colon: Secondary | ICD-10-CM

## 2011-11-05 DIAGNOSIS — C189 Malignant neoplasm of colon, unspecified: Secondary | ICD-10-CM

## 2011-11-05 DIAGNOSIS — Z5111 Encounter for antineoplastic chemotherapy: Secondary | ICD-10-CM

## 2011-11-05 LAB — COMPREHENSIVE METABOLIC PANEL
CO2: 27 mEq/L (ref 19–32)
Creatinine, Ser: 1.05 mg/dL (ref 0.50–1.35)
Glucose, Bld: 103 mg/dL — ABNORMAL HIGH (ref 70–99)
Total Bilirubin: 0.5 mg/dL (ref 0.3–1.2)
Total Protein: 6.5 g/dL (ref 6.0–8.3)

## 2011-11-05 LAB — CBC WITH DIFFERENTIAL/PLATELET
Basophils Absolute: 0 10*3/uL (ref 0.0–0.1)
Eosinophils Absolute: 0.1 10*3/uL (ref 0.0–0.5)
HGB: 15.2 g/dL (ref 13.0–17.1)
LYMPH%: 31.5 % (ref 14.0–49.0)
MCV: 89.6 fL (ref 79.3–98.0)
MONO%: 12.6 % (ref 0.0–14.0)
NEUT#: 2 10*3/uL (ref 1.5–6.5)
NEUT%: 54 % (ref 39.0–75.0)
Platelets: 97 10*3/uL — ABNORMAL LOW (ref 140–400)

## 2011-11-05 MED ORDER — FLUOROURACIL CHEMO INJECTION 5 GM/100ML
2375.0000 mg/m2 | INTRAVENOUS | Status: DC
  Administered 2011-11-05: 4800 mg via INTRAVENOUS
  Filled 2011-11-05: qty 96

## 2011-11-05 MED ORDER — FLUOROURACIL CHEMO INJECTION 2.5 GM/50ML
400.0000 mg/m2 | Freq: Once | INTRAVENOUS | Status: AC
  Administered 2011-11-05: 800 mg via INTRAVENOUS
  Filled 2011-11-05: qty 16

## 2011-11-05 MED ORDER — LEUCOVORIN CALCIUM INJECTION 350 MG
395.0000 mg/m2 | Freq: Once | INTRAMUSCULAR | Status: AC
  Administered 2011-11-05: 798 mg via INTRAVENOUS
  Filled 2011-11-05: qty 39.9

## 2011-11-05 MED ORDER — DEXTROSE 5 % IV SOLN
Freq: Once | INTRAVENOUS | Status: AC
  Administered 2011-11-05: 10:00:00 via INTRAVENOUS

## 2011-11-05 NOTE — Progress Notes (Signed)
OFFICE PROGRESS NOTE   INTERVAL HISTORY:   He completed another cycle of FOLFOX on 10/22/2011. He developed hives during the oxaliplatin infusion. The oxaliplatin was discontinued. The hives resolved after Benadryl. He reports mild numbness in the fingertips and toes. He denies nausea and diarrhea. He has mild difficulty buttoning his shirt the  Objective:  Vital signs in last 24 hours:  Blood pressure 131/84, pulse 72, temperature 98.2 F (36.8 C), temperature source Oral, height 5\' 10"  (1.778 m), weight 192 lb 4.8 oz (87.227 kg).    HEENT: No thrush or ulcers Resp: Lungs clear bilaterally Cardio: Regular rate and rhythm GI: No hepatomegaly, no mass Vascular: No leg edema Neuro: The vibratory sense is intact at the fingertip bilaterally    Portacath/PICC-without erythema  Lab Results:  Lab Results  Component Value Date   WBC 3.7* 11/05/2011   HGB 15.2 11/05/2011   HCT 41.3 11/05/2011   MCV 89.6 11/05/2011   PLT 97* 11/05/2011   ANC 2.0    Medications: I have reviewed the patient's current medications.  Assessment/Plan: 1. 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 Microsatellite instability testing by immunohistochemistry confirmed a microsatellite stable tumor. He began adjuvant FOLFOX chemotherapy 05/21/2011. He completed cycle #7 on 08/14/2011. Oxaliplatin was held with cycle #6 due to thrombocytopenia. The oxaliplatin was dose reduced by 20% with cycle #7. He completed cycle #11on 10/22/2011. 2. Delayed nausea, improved with the addition of Aloxi. 3. Status post Port-A-Cath placement 05/20/2011. 4. 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 5. Kidney stone in 2009. 6. L5-S1 disk surgery in 1997. 7. Low heart rate, likely due to his young age and physical conditioning. 8. Thrombocytopenia secondary to chemotherapy-stable 9. Abnormal sensation with  swallowing, likely related to oxaliplatin. Persistent. 10. 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 He now has mild numbness in the fingers  11. Mucositis following cycle 10 FOLFOX-resolved 12. Allergic reaction to oxaliplatin on 10/22/2011-hives that resolved with Doree Barthel   Disposition:  He will complete a final planned cycle of adjuvant therapy today. We decided to hold the oxaliplatin due to the allergic reaction, from cytopenia, and neuropathy symptoms.  He will return for a CBC in 2 weeks. He will be scheduled for removal of the Port-A-Cath in approximately 3 weeks.  He will return for an office visit, CEA, and restaging CT evaluation in early June.[id number]    Lucile Shutters, MD  11/05/2011  11:20 AM

## 2011-11-05 NOTE — Telephone Encounter (Signed)
appt made and printed for pt and contrast given  aom

## 2011-11-05 NOTE — Telephone Encounter (Signed)
Left message on voicemail for pt to call office. Per Dr. Truett Perna: Pt to begin Aspirin 81mg  daily after port a cath is removed as long as PLT within normal range. There is some evidence that Aspirin decreases relapse rate.

## 2011-11-07 ENCOUNTER — Ambulatory Visit (HOSPITAL_BASED_OUTPATIENT_CLINIC_OR_DEPARTMENT_OTHER): Payer: BC Managed Care – PPO

## 2011-11-07 VITALS — BP 141/88 | HR 79 | Temp 97.9°F

## 2011-11-07 DIAGNOSIS — C189 Malignant neoplasm of colon, unspecified: Secondary | ICD-10-CM

## 2011-11-07 DIAGNOSIS — Z469 Encounter for fitting and adjustment of unspecified device: Secondary | ICD-10-CM

## 2011-11-07 MED ORDER — SODIUM CHLORIDE 0.9 % IJ SOLN
10.0000 mL | INTRAMUSCULAR | Status: DC | PRN
  Administered 2011-11-07: 10 mL

## 2011-11-07 MED ORDER — HEPARIN SOD (PORK) LOCK FLUSH 100 UNIT/ML IV SOLN
500.0000 [IU] | Freq: Once | INTRAVENOUS | Status: AC | PRN
  Administered 2011-11-07: 500 [IU]

## 2011-11-09 ENCOUNTER — Other Ambulatory Visit: Payer: Self-pay | Admitting: Radiology

## 2011-11-10 ENCOUNTER — Telehealth: Payer: Self-pay | Admitting: *Deleted

## 2011-11-10 ENCOUNTER — Other Ambulatory Visit: Payer: Self-pay | Admitting: *Deleted

## 2011-11-10 DIAGNOSIS — C189 Malignant neoplasm of colon, unspecified: Secondary | ICD-10-CM

## 2011-11-10 NOTE — Telephone Encounter (Signed)
Pt called and would like to know if pt would need to have labs rechecked prior to port removal on Fri. 11/13/11.    Pt stated he was informed at his last office visit by md that pt would need lab rechecked after last chemo completed on Sat. 11/07/11. Pt's   Phone    [phone].

## 2011-11-10 NOTE — Telephone Encounter (Signed)
Spoke with Darl Pikes, RN for Dr. Truett Perna.   OK for pt to have labs rechecked prior to port removal.   Spoke with pt and gave pt date and time for lab on 11/11/11.   Pt voiced understanding.

## 2011-11-11 ENCOUNTER — Other Ambulatory Visit (HOSPITAL_BASED_OUTPATIENT_CLINIC_OR_DEPARTMENT_OTHER): Payer: BC Managed Care – PPO | Admitting: Lab

## 2011-11-11 ENCOUNTER — Other Ambulatory Visit: Payer: Self-pay | Admitting: Radiology

## 2011-11-11 DIAGNOSIS — C189 Malignant neoplasm of colon, unspecified: Secondary | ICD-10-CM

## 2011-11-11 LAB — CBC WITH DIFFERENTIAL/PLATELET
Basophils Absolute: 0 10*3/uL (ref 0.0–0.1)
Eosinophils Absolute: 0 10*3/uL (ref 0.0–0.5)
HGB: 15.5 g/dL (ref 13.0–17.1)
MONO#: 0.2 10*3/uL (ref 0.1–0.9)
NEUT#: 1.3 10*3/uL — ABNORMAL LOW (ref 1.5–6.5)
RBC: 4.63 10*6/uL (ref 4.20–5.82)
RDW: 13 % (ref 11.0–14.6)
WBC: 3 10*3/uL — ABNORMAL LOW (ref 4.0–10.3)

## 2011-11-11 LAB — COMPREHENSIVE METABOLIC PANEL
Albumin: 4.1 g/dL (ref 3.5–5.2)
CO2: 27 mEq/L (ref 19–32)
Calcium: 9.1 mg/dL (ref 8.4–10.5)
Chloride: 105 mEq/L (ref 96–112)
Glucose, Bld: 97 mg/dL (ref 70–99)
Potassium: 4.3 mEq/L (ref 3.5–5.3)
Sodium: 142 mEq/L (ref 135–145)
Total Protein: 7.1 g/dL (ref 6.0–8.3)

## 2011-11-12 ENCOUNTER — Other Ambulatory Visit: Payer: Self-pay | Admitting: Radiology

## 2011-11-12 ENCOUNTER — Telehealth: Payer: Self-pay | Admitting: *Deleted

## 2011-11-12 ENCOUNTER — Telehealth (HOSPITAL_COMMUNITY): Payer: Self-pay | Admitting: Oncology

## 2011-11-12 ENCOUNTER — Encounter (HOSPITAL_COMMUNITY): Payer: Self-pay | Admitting: Pharmacy Technician

## 2011-11-12 NOTE — Telephone Encounter (Signed)
Called patient with his lab results of 11/11/11. OK for PAC removal on 11/13/11.

## 2011-11-13 ENCOUNTER — Encounter (HOSPITAL_COMMUNITY): Payer: Self-pay

## 2011-11-13 ENCOUNTER — Ambulatory Visit (HOSPITAL_COMMUNITY)
Admission: RE | Admit: 2011-11-13 | Discharge: 2011-11-13 | Disposition: A | Discharge status: Home / Self Care | Payer: BC Managed Care – PPO | Source: Ambulatory Visit | Attending: Oncology | Admitting: Oncology

## 2011-11-13 ENCOUNTER — Inpatient Hospital Stay (HOSPITAL_COMMUNITY): Admission: RE | Admit: 2011-11-13 | Payer: BC Managed Care – PPO | Source: Ambulatory Visit

## 2011-11-13 DIAGNOSIS — C189 Malignant neoplasm of colon, unspecified: Secondary | ICD-10-CM | POA: Insufficient documentation

## 2011-11-13 DIAGNOSIS — Z452 Encounter for adjustment and management of vascular access device: Secondary | ICD-10-CM | POA: Insufficient documentation

## 2011-11-13 MED ORDER — CEFAZOLIN SODIUM-DEXTROSE 2-3 GM-% IV SOLR
INTRAVENOUS | Status: AC
  Administered 2011-11-13: 2 g via INTRAVENOUS
  Filled 2011-11-13: qty 50

## 2011-11-13 MED ORDER — CEFAZOLIN SODIUM-DEXTROSE 2-3 GM-% IV SOLR
2.0000 g | INTRAVENOUS | Status: AC
  Administered 2011-11-13: 2 g via INTRAVENOUS

## 2011-11-13 MED ORDER — SODIUM CHLORIDE 0.9 % IV SOLN: INTRAVENOUS | Status: DC

## 2011-11-13 MED ORDER — LIDOCAINE HCL 1 % IJ SOLN
INTRAMUSCULAR | Status: AC
  Filled 2011-11-13: qty 20

## 2011-11-13 NOTE — H&P (Signed)
Agree 

## 2011-11-13 NOTE — H&P (Signed)
Derek Crosby is an 51 y.o. male.   Chief Complaint: Colon Ca; treatment complete HPI: PAC removal today  Past Medical History  Diagnosis Date  . Cancer     No past surgical history on file.  No family history on file. Social History:  does not have a smoking history on file. He does not have any smokeless tobacco history on file. His alcohol and drug histories not on file.  Allergies:  Allergies  Allergen Reactions  . Oxaliplatin Hives    Medications Prior to Admission  Medication Sig Dispense Refill  . cholecalciferol (VITAMIN D) 1000 UNITS tablet Take 1,000 Units by mouth daily.       . fish oil-omega-3 fatty acids 1000 MG capsule Take 1 g by mouth daily.        Marland Kitchen ibuprofen (ADVIL,MOTRIN) 200 MG tablet Take 400 mg by mouth every 6 (six) hours as needed. Pain       . lidocaine-prilocaine (EMLA) cream Apply 1 application topically as needed. For port       . Multiple Vitamin (MULTIVITAMIN) tablet Take 1 tablet by mouth daily.        . ondansetron (ZOFRAN) 8 MG tablet Take 8 mg by mouth every 8 (eight) hours as needed. Nausea       . prochlorperazine (COMPAZINE) 10 MG tablet Take 10 mg by mouth every 6 (six) hours as needed. Nausea       . ranitidine (ZANTAC) 150 MG capsule Take 150 mg by mouth 2 (two) times daily as needed. Heart burn        Medications Prior to Admission  Medication Dose Route Frequency Provider Last Rate Last Dose  . 0.9 %  sodium chloride infusion   Intravenous Continuous D Jeananne Rama, PA      . ceFAZolin (ANCEF) 2-3 GM-% IVPB SOLR           . ceFAZolin (ANCEF) IVPB 2 g/50 mL premix  2 g Intravenous On Call D Kevin Allred, PA      . lidocaine (XYLOCAINE) 1 % (with pres) injection             No results found for this or any previous visit (from the past 48 hour(s)). No results found.  Review of Systems  Constitutional: Negative for fever.  Respiratory: Negative for cough.   Cardiovascular: Negative for chest pain.  Gastrointestinal: Negative  for nausea and vomiting.    Blood pressure 151/91, pulse 69, temperature 97.6 F (36.4 C), SpO2 99.00%. Physical Exam  Constitutional: He is oriented to person, place, and time. He appears well-developed and well-nourished.  HENT:  Head: Normocephalic.  Eyes: EOM are normal.  Neck: Normal range of motion.  Cardiovascular: Normal rate, regular rhythm and normal heart sounds.   No murmur heard. Respiratory: Effort normal and breath sounds normal. He has no wheezes.  GI: Soft. Bowel sounds are normal. There is no tenderness.  Musculoskeletal: Normal range of motion.  Neurological: He is alert and oriented to person, place, and time.     Assessment/Plan Colon ca; Port a Cath no longer needed-treatment complete Scheduled for PAC removal today Pt aware of procedure benefits and risks and agreeable to proceed. Consent signed.  Latif Nazareno A 11/13/2011, 3:27 PM

## 2012-02-12 ENCOUNTER — Telehealth: Payer: Self-pay | Admitting: Oncology

## 2012-02-12 NOTE — Telephone Encounter (Signed)
called pt Derek Crosby that his appt on 06/06 was moved to 06/13.  asked pt to rtn call to confirm appt changes

## 2012-02-17 ENCOUNTER — Other Ambulatory Visit (HOSPITAL_BASED_OUTPATIENT_CLINIC_OR_DEPARTMENT_OTHER): Payer: BC Managed Care – PPO | Admitting: Lab

## 2012-02-17 ENCOUNTER — Ambulatory Visit (HOSPITAL_COMMUNITY)
Admission: RE | Admit: 2012-02-17 | Discharge: 2012-02-17 | Disposition: A | Discharge status: Home / Self Care | Payer: BC Managed Care – PPO | Source: Ambulatory Visit | Attending: Oncology | Admitting: Oncology

## 2012-02-17 ENCOUNTER — Encounter (HOSPITAL_COMMUNITY): Payer: Self-pay

## 2012-02-17 ENCOUNTER — Other Ambulatory Visit: Payer: Self-pay | Admitting: Oncology

## 2012-02-17 ENCOUNTER — Other Ambulatory Visit (HOSPITAL_COMMUNITY): Payer: BC Managed Care – PPO

## 2012-02-17 DIAGNOSIS — Z9049 Acquired absence of other specified parts of digestive tract: Secondary | ICD-10-CM | POA: Insufficient documentation

## 2012-02-17 DIAGNOSIS — K7689 Other specified diseases of liver: Secondary | ICD-10-CM | POA: Insufficient documentation

## 2012-02-17 DIAGNOSIS — Z980 Intestinal bypass and anastomosis status: Secondary | ICD-10-CM | POA: Insufficient documentation

## 2012-02-17 DIAGNOSIS — N281 Cyst of kidney, acquired: Secondary | ICD-10-CM | POA: Insufficient documentation

## 2012-02-17 DIAGNOSIS — Z9221 Personal history of antineoplastic chemotherapy: Secondary | ICD-10-CM | POA: Insufficient documentation

## 2012-02-17 DIAGNOSIS — C189 Malignant neoplasm of colon, unspecified: Secondary | ICD-10-CM

## 2012-02-17 DIAGNOSIS — R918 Other nonspecific abnormal finding of lung field: Secondary | ICD-10-CM | POA: Insufficient documentation

## 2012-02-17 LAB — CBC WITH DIFFERENTIAL/PLATELET
BASO%: 0.4 % (ref 0.0–2.0)
HCT: 47.9 % (ref 38.4–49.9)
LYMPH%: 27.4 % (ref 14.0–49.0)
MCHC: 35.5 g/dL (ref 32.0–36.0)
MCV: 92.1 fL (ref 79.3–98.0)
MONO%: 7.3 % (ref 0.0–14.0)
NEUT%: 63.4 % (ref 39.0–75.0)
Platelets: 140 10*3/uL (ref 140–400)
RBC: 5.2 10*6/uL (ref 4.20–5.82)
nRBC: 0 % (ref 0–0)

## 2012-02-17 LAB — CMP (CANCER CENTER ONLY)
AST: 31 U/L (ref 11–38)
Albumin: 3.8 g/dL (ref 3.3–5.5)
Alkaline Phosphatase: 59 U/L (ref 26–84)
BUN, Bld: 18 mg/dL (ref 7–22)
Calcium: 9.1 mg/dL (ref 8.0–10.3)
Chloride: 100 mEq/L (ref 98–108)
Creat: 1.2 mg/dl (ref 0.6–1.2)
Glucose, Bld: 95 mg/dL (ref 73–118)

## 2012-02-17 MED ORDER — IOHEXOL 300 MG/ML  SOLN
100.0000 mL | Freq: Once | INTRAMUSCULAR | Status: AC | PRN
  Administered 2012-02-17: 100 mL via INTRAVENOUS

## 2012-02-18 ENCOUNTER — Ambulatory Visit: Payer: BC Managed Care – PPO | Admitting: Oncology

## 2012-02-24 ENCOUNTER — Telehealth: Payer: Self-pay | Admitting: *Deleted

## 2012-02-24 NOTE — Telephone Encounter (Signed)
Called pt, he is aware that CT was negative. Appt for 6/13 was confirmed.

## 2012-02-24 NOTE — Telephone Encounter (Signed)
Message copied by Caleb Popp on Wed Feb 24, 2012  5:31 PM ------      Message from: Thornton Papas B      Created: Wed Feb 17, 2012  9:34 PM       Please call patient, cts are negative, f/u as scheduled

## 2012-02-25 ENCOUNTER — Ambulatory Visit (HOSPITAL_BASED_OUTPATIENT_CLINIC_OR_DEPARTMENT_OTHER): Payer: BC Managed Care – PPO | Admitting: Oncology

## 2012-02-25 VITALS — BP 127/69 | HR 64 | Temp 97.6°F | Ht 70.0 in | Wt 191.5 lb

## 2012-02-25 DIAGNOSIS — C187 Malignant neoplasm of sigmoid colon: Secondary | ICD-10-CM

## 2012-02-25 DIAGNOSIS — R209 Unspecified disturbances of skin sensation: Secondary | ICD-10-CM

## 2012-02-25 DIAGNOSIS — C189 Malignant neoplasm of colon, unspecified: Secondary | ICD-10-CM

## 2012-02-25 NOTE — Progress Notes (Signed)
   [facility]    OFFICE PROGRESS NOTE   INTERVAL HISTORY:   He returns as scheduled. He feels well. The neuropathy symptoms have improved. He has mild numbness in a few fingers on each hand. This does not interfere with activity. He also has mild numbness in the feet. He has been exercising regularly.  Derek Crosby recently injured the right lower leg and developed an ecchymosis. This has improved.  Objective:  Vital signs in last 24 hours:  Blood pressure 127/69, pulse 64, temperature 97.6 F (36.4 C), temperature source Oral, height 5\' 10"  (1.778 m), weight 191 lb 8 oz (86.864 kg).    HEENT: Neck without mass Lymphatics: No cervical, supraclavicular, axillary, or inguinal nodes Resp: Lungs clear bilaterally Cardio: Regular rate and rhythm GI: No hepatosplenomegaly, no mass Vascular: No leg edema. Sunburn at the left lower leg.      Lab Results:  Lab Results  Component Value Date   WBC 4.8 02/17/2012   HGB 17.0 02/17/2012   HCT 47.9 02/17/2012   MCV 92.1 02/17/2012   PLT 140 02/17/2012   ANC 3.1 CEA 0.8  X-ray: CTs of the chest, abdomen, and on 02/17/2012-stable 6 mm right middle lobe nodule compared to a PET scan from 02/11/2011, no new/suspicious pulmonary nodules. No evidence of metastatic disease in the abdomen or pelvis.    Medications: I have reviewed the patient's current medications.  Assessment/Plan: 1. Stage III (pT2 pN1a) adenocarcinoma of the sigmoid colon, status post a sigmoid colectomy with primary anastomosis 03/25/2011. Microsatellite instability testing by immunohistochemistry confirmed a microsatellite stable tumor. He began adjuvant FOLFOX chemotherapy 05/21/2011. He completed cycle #7 on 08/14/2011. Oxaliplatin was held with cycle #6 due to thrombocytopenia. The oxaliplatin was dose reduced by 20% with cycle #7. He completed cycle #12 on 11/05/2011          -restaging CT on 02/17/2012 without evidence of recurrent disease 2. Delayed nausea,  improved with the addition of Aloxi. 3. Status post Port-A-Cath placement 05/20/2011. 4. 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 5. Kidney stone in 2009. 6. L5-S1 disk surgery in 1997. 7. Low heart rate, likely due to his young age and physical conditioning. 8. History of Thrombocytopenia secondary to chemotherapy-resolved 9. History of Abnormal sensation with swallowing, likely related to oxaliplatin. 10. 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 He now has mild numbness in the fingers and feet 11. Mucositis following cycle 10 FOLFOX-resolved 12. Allergic reaction to oxaliplatin on 10/22/2011-hives that resolved with Benadryl 13. Right lung nodule-stable on the CT 02/17/2012  Disposition:  He remains in clinical remission from colon cancer. Derek Crosby will return for an office visit and CEA in 6 months. He is scheduled for a surveillance colonoscopy within the next month.  The neuropathy symptoms appear to be improving.   Derek Papas, MD  02/25/2012  5:20 PM

## 2012-02-26 ENCOUNTER — Telehealth: Payer: Self-pay | Admitting: Oncology

## 2012-02-26 NOTE — Telephone Encounter (Signed)
called pt and provided appts for [phone]

## 2012-03-01 ENCOUNTER — Telehealth: Payer: Self-pay | Admitting: Medical Oncology

## 2012-03-01 NOTE — Telephone Encounter (Signed)
Pt called informed of ct scan negative and to follow schedule. Pt verbalized understanding.  dmr

## 2012-04-20 ENCOUNTER — Encounter (HOSPITAL_BASED_OUTPATIENT_CLINIC_OR_DEPARTMENT_OTHER): Payer: Self-pay | Admitting: *Deleted

## 2012-04-20 NOTE — Progress Notes (Signed)
No labs needed

## 2012-04-21 ENCOUNTER — Other Ambulatory Visit: Payer: Self-pay | Admitting: Orthopedic Surgery

## 2012-04-21 NOTE — H&P (Addendum)
  Derek Crosby is an 51 y.o. male.   Chief Complaint: c/o cystic mass on the radial aspect of his right ring finger. HPI: Derek Crosby came by for a consult regarding a myxoid cyst forming on the radial aspect of his right ring finger A-1 A-2 pulley junction. I had previously removed a cyst from Derek Crosby's right index finger in April 2011. This is causing localized pain. He returns requesting that we excise the cyst.    Past Medical History  Diagnosis Date  . Family history of anesthesia complication     mom=nausea  . colon ca dx'd 02/05/11    Past Surgical History  Procedure Date  . Partial colectomy 2012    sigmoid-cancer  . Port-a-cath removal 2012    insertion and out  . Cystoscopy/retrograde/ureteroscopy 2009    jj stent-rt-kidney stone  . Microdiscectomy lumbar 1997    No family history on file. Social History:  reports that he has never smoked. He does not have any smokeless tobacco history on file. He reports that he drinks alcohol. His drug history not on file.  Allergies:  Allergies  Allergen Reactions  . Oxaliplatin Hives    No prescriptions prior to admission    No results found for this or any previous visit (from the past 48 hour(s)).  No results found.   Pertinent items are noted in HPI.  Height 5\' 10"  (1.778 m), weight 82.555 kg (182 lb).  General appearance: alert Head: Normocephalic, without obvious abnormality Neck: supple, symmetrical, trachea midline Resp: clear to auscultation bilaterally Cardio: regular rate and rhythm GI: normal findings: bowel sounds normal Extremities: Inspection of his hand reveals no visible deformity. On palpation he has an 8 mm in diameter myxoid cyst overlying the A-1 A-2 pulley junction of the radial aspect of his right ring finger. This is tender to touch. He has full ROM of his finger in flexion/extension.   Pulses: 2+ and symmetric Skin: normal Neurologic: Grossly normal   Assessment/Plan Impression: Myxoid cyst  right ring finger  Plan: To the OR for excision of myxoid cyst right ring finger.The procedure, risks,benefits and post-op course were discussed with the patient at length and they were in agreement with the plan.   DASNOIT,Derek Crosby 04/21/2012, 4:02 PM     Derek Crosby  H&P documentation: 04/22/2012  -History and Physical Reviewed  -Patient has been re-examined  -No change in the plan of care  Derek Forster, MD

## 2012-04-22 ENCOUNTER — Ambulatory Visit (HOSPITAL_BASED_OUTPATIENT_CLINIC_OR_DEPARTMENT_OTHER)
Admission: RE | Admit: 2012-04-22 | Discharge: 2012-04-22 | Disposition: A | Discharge status: Home / Self Care | Payer: BC Managed Care – PPO | Source: Ambulatory Visit | Attending: Orthopedic Surgery | Admitting: Orthopedic Surgery

## 2012-04-22 ENCOUNTER — Encounter (HOSPITAL_BASED_OUTPATIENT_CLINIC_OR_DEPARTMENT_OTHER): Payer: Self-pay | Admitting: *Deleted

## 2012-04-22 ENCOUNTER — Encounter (HOSPITAL_BASED_OUTPATIENT_CLINIC_OR_DEPARTMENT_OTHER): Payer: Self-pay | Admitting: Certified Registered Nurse Anesthetist

## 2012-04-22 ENCOUNTER — Encounter (HOSPITAL_BASED_OUTPATIENT_CLINIC_OR_DEPARTMENT_OTHER)
Admission: RE | Disposition: A | Discharge status: Home / Self Care | Payer: Self-pay | Source: Ambulatory Visit | Attending: Orthopedic Surgery

## 2012-04-22 ENCOUNTER — Ambulatory Visit (HOSPITAL_BASED_OUTPATIENT_CLINIC_OR_DEPARTMENT_OTHER): Payer: BC Managed Care – PPO | Admitting: Certified Registered Nurse Anesthetist

## 2012-04-22 DIAGNOSIS — Z85038 Personal history of other malignant neoplasm of large intestine: Secondary | ICD-10-CM | POA: Insufficient documentation

## 2012-04-22 DIAGNOSIS — M6740 Ganglion, unspecified site: Secondary | ICD-10-CM | POA: Insufficient documentation

## 2012-04-22 HISTORY — PX: MASS EXCISION: SHX2000

## 2012-04-22 LAB — POCT HEMOGLOBIN-HEMACUE: Hemoglobin: 16.3 g/dL (ref 13.0–17.0)

## 2012-04-22 SURGERY — EXCISION MASS
Anesthesia: Monitor Anesthesia Care | Site: Hand | Laterality: Right | Wound class: Clean

## 2012-04-22 MED ORDER — METOCLOPRAMIDE HCL 5 MG/ML IJ SOLN: 10.0000 mg | Freq: Once | INTRAMUSCULAR | Status: DC | PRN

## 2012-04-22 MED ORDER — PROPOFOL 10 MG/ML IV EMUL
INTRAVENOUS | Status: DC | PRN
  Administered 2012-04-22: 75 ug/kg/min via INTRAVENOUS

## 2012-04-22 MED ORDER — DEXAMETHASONE SODIUM PHOSPHATE 4 MG/ML IJ SOLN
INTRAMUSCULAR | Status: DC | PRN
  Administered 2012-04-22: 4 mg via INTRAVENOUS

## 2012-04-22 MED ORDER — LACTATED RINGERS IV SOLN
INTRAVENOUS | Status: DC
  Administered 2012-04-22: 09:00:00 via INTRAVENOUS

## 2012-04-22 MED ORDER — FENTANYL CITRATE 0.05 MG/ML IJ SOLN
INTRAMUSCULAR | Status: DC | PRN
  Administered 2012-04-22: 50 ug via INTRAVENOUS

## 2012-04-22 MED ORDER — LIDOCAINE HCL (CARDIAC) 20 MG/ML IV SOLN
INTRAVENOUS | Status: DC | PRN
  Administered 2012-04-22: 30 mg via INTRAVENOUS

## 2012-04-22 MED ORDER — OXYCODONE HCL 5 MG PO TABS: 5.0000 mg | ORAL_TABLET | Freq: Once | ORAL | Status: DC | PRN

## 2012-04-22 MED ORDER — ONDANSETRON HCL 4 MG/2ML IJ SOLN
INTRAMUSCULAR | Status: DC | PRN
  Administered 2012-04-22: 4 mg via INTRAVENOUS

## 2012-04-22 MED ORDER — CHLORHEXIDINE GLUCONATE 4 % EX LIQD: 60.0000 mL | Freq: Once | CUTANEOUS | Status: DC

## 2012-04-22 MED ORDER — LIDOCAINE HCL (PF) 2 % IJ SOLN
INTRAMUSCULAR | Status: DC | PRN
  Administered 2012-04-22: 2.5 mL

## 2012-04-22 MED ORDER — MIDAZOLAM HCL 5 MG/5ML IJ SOLN
INTRAMUSCULAR | Status: DC | PRN
  Administered 2012-04-22: 2 mg via INTRAVENOUS

## 2012-04-22 MED ORDER — FENTANYL CITRATE 0.05 MG/ML IJ SOLN: 25.0000 ug | INTRAMUSCULAR | Status: DC | PRN

## 2012-04-22 MED ORDER — OXYCODONE HCL 5 MG/5ML PO SOLN
5.0000 mg | Freq: Once | ORAL | Status: DC | PRN
Start: 2012-04-22 — End: 2012-04-22

## 2012-04-22 MED ORDER — TRAMADOL HCL 50 MG PO TABS
ORAL_TABLET | ORAL | Status: AC
Start: 2012-04-22 — End: 2012-05-02

## 2012-04-22 SURGICAL SUPPLY — 54 items
BANDAGE ADHESIVE 1X3 (GAUZE/BANDAGES/DRESSINGS) IMPLANT
BANDAGE ELASTIC 3 VELCRO ST LF (GAUZE/BANDAGES/DRESSINGS) IMPLANT
BLADE MINI RND TIP GREEN BEAV (BLADE) IMPLANT
BLADE SURG 15 STRL LF DISP TIS (BLADE) ×1 IMPLANT
BLADE SURG 15 STRL SS (BLADE) ×2
BNDG CMPR 9X4 STRL LF SNTH (GAUZE/BANDAGES/DRESSINGS) ×1
BNDG CMPR MD 5X2 ELC HKLP STRL (GAUZE/BANDAGES/DRESSINGS)
BNDG COHESIVE 1X5 TAN STRL LF (GAUZE/BANDAGES/DRESSINGS) ×3 IMPLANT
BNDG ELASTIC 2 VLCR STRL LF (GAUZE/BANDAGES/DRESSINGS) IMPLANT
BNDG ESMARK 4X9 LF (GAUZE/BANDAGES/DRESSINGS) ×1 IMPLANT
BRUSH SCRUB EZ PLAIN DRY (MISCELLANEOUS) ×2 IMPLANT
CLOTH BEACON ORANGE TIMEOUT ST (SAFETY) ×2 IMPLANT
CORDS BIPOLAR (ELECTRODE) IMPLANT
COVER MAYO STAND STRL (DRAPES) ×2 IMPLANT
COVER TABLE BACK 60X90 (DRAPES) ×2 IMPLANT
CUFF TOURNIQUET SINGLE 18IN (TOURNIQUET CUFF) ×1 IMPLANT
DECANTER SPIKE VIAL GLASS SM (MISCELLANEOUS) IMPLANT
DRAIN PENROSE 1/2X12 LTX STRL (WOUND CARE) IMPLANT
DRAIN PENROSE 1/4X12 LTX STRL (WOUND CARE) IMPLANT
DRAPE EXTREMITY T 121X128X90 (DRAPE) ×2 IMPLANT
DRAPE SURG 17X23 STRL (DRAPES) ×2 IMPLANT
GAUZE XEROFORM 1X8 LF (GAUZE/BANDAGES/DRESSINGS) IMPLANT
GLOVE BIO SURGEON STRL SZ7 (GLOVE) ×1 IMPLANT
GLOVE BIOGEL PI IND STRL 7.0 (GLOVE) IMPLANT
GLOVE BIOGEL PI IND STRL 8 (GLOVE) ×1 IMPLANT
GLOVE BIOGEL PI INDICATOR 7.0 (GLOVE) ×2
GLOVE BIOGEL PI INDICATOR 8 (GLOVE) ×1
GLOVE ORTHO TXT STRL SZ7.5 (GLOVE) ×2 IMPLANT
GOWN PREVENTION PLUS XLARGE (GOWN DISPOSABLE) ×2 IMPLANT
GOWN STRL REIN XL XLG (GOWN DISPOSABLE) ×4 IMPLANT
NDL BLUNT 17GA (NEEDLE) ×1 IMPLANT
NEEDLE 27GAX1X1/2 (NEEDLE) ×1 IMPLANT
NEEDLE BLUNT 17GA (NEEDLE) IMPLANT
PACK BASIN DAY SURGERY FS (CUSTOM PROCEDURE TRAY) ×2 IMPLANT
PAD CAST 3X4 CTTN HI CHSV (CAST SUPPLIES) IMPLANT
PADDING CAST COTTON 3X4 STRL (CAST SUPPLIES)
PADDING UNDERCAST 2  STERILE (CAST SUPPLIES) IMPLANT
SPLINT PLASTER CAST XFAST 3X15 (CAST SUPPLIES) IMPLANT
SPLINT PLASTER XTRA FASTSET 3X (CAST SUPPLIES)
SPONGE GAUZE 4X4 12PLY (GAUZE/BANDAGES/DRESSINGS) IMPLANT
STOCKINETTE 4X48 STRL (DRAPES) ×2 IMPLANT
STRIP CLOSURE SKIN 1/2X4 (GAUZE/BANDAGES/DRESSINGS) ×1 IMPLANT
SUT ETHILON 5 0 P 3 18 (SUTURE)
SUT MERSILENE 4 0 P 3 (SUTURE) IMPLANT
SUT NYLON ETHILON 5-0 P-3 1X18 (SUTURE) ×1 IMPLANT
SUT PROLENE 3 0 PS 2 (SUTURE) IMPLANT
SUT PROLENE 4 0 PS 2 18 (SUTURE) ×1 IMPLANT
SYR 20CC LL (SYRINGE) ×1 IMPLANT
SYR 3ML 23GX1 SAFETY (SYRINGE) IMPLANT
SYR CONTROL 10ML LL (SYRINGE) ×1 IMPLANT
TOWEL OR 17X24 6PK STRL BLUE (TOWEL DISPOSABLE) ×2 IMPLANT
TRAY DSU PREP LF (CUSTOM PROCEDURE TRAY) ×2 IMPLANT
UNDERPAD 30X30 INCONTINENT (UNDERPADS AND DIAPERS) ×2 IMPLANT
WATER STERILE IRR 1000ML POUR (IV SOLUTION) IMPLANT

## 2012-04-22 NOTE — Brief Op Note (Signed)
04/22/2012  10:33 AM  PATIENT:  Derek Crosby  51 y.o. male  PRE-OPERATIVE DIAGNOSIS:  Right ring A-1 cyst  POST-OPERATIVE DIAGNOSIS:  Right ring A-1 cyst  PROCEDURE:  Procedure(s) (LRB): EXCISION MASS (Right)  SURGEON:  Surgeon(s) and Role:    * Wyn Forster., MD - Primary  PHYSICIAN ASSISTANT:   ASSISTANTS: Mallory Shirk.A-C   ANESTHESIA:   local  EBL:  Total I/O In: 600 [I.V.:600] Out: -   BLOOD ADMINISTERED:none  DRAINS: none   LOCAL MEDICATIONS USED:  XYLOCAINE   SPECIMEN:  No Specimen  DISPOSITION OF SPECIMEN:  N/A  COUNTS:  YES  TOURNIQUET:  * Missing tourniquet times found for documented tourniquets in log:  [id number] *  DICTATION: .Other Dictation: Dictation Number [id number]  PLAN OF CARE: Discharge to home after PACU  PATIENT DISPOSITION:  PACU - hemodynamically stable.

## 2012-04-22 NOTE — Op Note (Signed)
[id number]  

## 2012-04-22 NOTE — OR Nursing (Signed)
No specimen to be sent per request of Dr. Teressa Senter.

## 2012-04-22 NOTE — Anesthesia Postprocedure Evaluation (Signed)
Anesthesia Post Note  Patient: Derek Crosby  Procedure(s) Performed: Procedure(s) (LRB): EXCISION MASS (Right)  Anesthesia type: MAC  Patient location: PACU  Post pain: Pain level controlled  Post assessment: Patient's Cardiovascular Status Stable  Last Vitals:  Filed Vitals:   04/22/12 1045  BP: 114/61  Pulse: 55  Temp:   Resp: 19    Post vital signs: Reviewed and stable  Level of consciousness: alert  Complications: No apparent anesthesia complications

## 2012-04-22 NOTE — Anesthesia Preprocedure Evaluation (Addendum)
Anesthesia Evaluation  Patient identified by MRN, date of birth, ID band Patient awake    Reviewed: Allergy & Precautions, H&P , NPO status , Patient's Chart, lab work & pertinent test results, reviewed documented beta blocker date and time , Unable to perform ROS - Chart review only  Airway Mallampati: II TM Distance: >3 FB Neck ROM: full    Dental   Pulmonary neg pulmonary ROS,  breath sounds clear to auscultation        Cardiovascular negative cardio ROS  Rhythm:regular     Neuro/Psych negative neurological ROS  negative psych ROS   GI/Hepatic negative GI ROS, Neg liver ROS,   Endo/Other  negative endocrine ROS  Renal/GU negative Renal ROS  negative genitourinary   Musculoskeletal   Abdominal   Peds  Hematology negative hematology ROS (+)   Anesthesia Other Findings See surgeon's H&P   Reproductive/Obstetrics negative OB ROS                           Anesthesia Physical Anesthesia Plan  ASA: I  Anesthesia Plan: MAC   Post-op Pain Management:    Induction: Intravenous  Airway Management Planned: Simple Face Mask  Additional Equipment:   Intra-op Plan:   Post-operative Plan:   Informed Consent: I have reviewed the patients History and Physical, chart, labs and discussed the procedure including the risks, benefits and alternatives for the proposed anesthesia with the patient or authorized representative who has indicated his/her understanding and acceptance.   Dental Advisory Given  Plan Discussed with: CRNA and Surgeon  Anesthesia Plan Comments:         Anesthesia Quick Evaluation

## 2012-04-22 NOTE — Anesthesia Procedure Notes (Signed)
Procedure Name: MAC Date/Time: 04/22/2012 10:04 AM Performed by: Liel Rudden D Pre-anesthesia Checklist: Patient identified, Emergency Drugs available, Suction available, Patient being monitored and Timeout performed Patient Re-evaluated:Patient Re-evaluated prior to inductionOxygen Delivery Method: Simple face mask

## 2012-04-22 NOTE — Transfer of Care (Signed)
Immediate Anesthesia Transfer of Care Note  Patient: Derek Crosby  Procedure(s) Performed: Procedure(s) (LRB): EXCISION MASS (Right)  Patient Location: PACU  Anesthesia Type: MAC  Level of Consciousness: awake, alert , oriented and patient cooperative  Airway & Oxygen Therapy: Patient Spontanous Breathing and Patient connected to face mask oxygen  Post-op Assessment: Report given to PACU RN and Post -op Vital signs reviewed and stable  Post vital signs: Reviewed and stable  Complications: No apparent anesthesia complications

## 2012-04-25 ENCOUNTER — Encounter (HOSPITAL_BASED_OUTPATIENT_CLINIC_OR_DEPARTMENT_OTHER): Payer: Self-pay | Admitting: Orthopedic Surgery

## 2012-04-25 NOTE — Op Note (Signed)
NAMEGEARY, RUFO                 ACCOUNT NO.:  [id number]  MEDICAL RECORD NO.:  [id number]  LOCATION:                                 FACILITY:  PHYSICIAN:  Katy Fitch. Quantel Mcinturff, M.D. 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  OPERATIONS:  Resection of myxoid cyst from right ring finger flexor retinaculum, deep to radial neurovascular bundle.  OPERATING SURGEON:  Katy Fitch. Pallie Swigert, MD.  ASSISTANT:  Marveen Reeks. Dasnoit, PA-C.  ANESTHESIA:  Lidocaine 2%, supplemented by IV sedation.  SUPERVISING ANESTHESIOLOGIST:  Janetta Hora. Gelene Mink, M.D.  INDICATIONS:  Derek Crosby is a 50 year old gentleman who referred through the courtesy of Dr. Blair Heys for management of a cyst/mass overlying the proximal phalangeal segment of his right ring finger flexor sheath.  [place] has had a prior experience with this in his index finger.  He returned requesting excision of the cyst of the ring finger as it was in a strategically uncomfortable location of the radial neurovascular bundle.  X-ray of his finger was unremarkable.  We had a detailed informed consent regarding this predicament and he agreed to proceed with debridement of the cyst under local anesthesia and sedation.  He understands that we cannot guarantee that he will not form a new myxoid cyst in the future.  PROCEDURE:  Kanan Sobek was brought to room 1 of the [facility] and placed supine position on the operating table.  Following Betadine prep of his palm and informed consent, 2% lidocaine was infiltrated into the metacarpal head level to obtain a digital block.  After  few moments, excellent anesthesia of the ring finger was achieved. The right hand and arm were then prepped with Betadine soap and solution, sterilely draped.  A pneumatic tourniquet was applied to the proximal right brachium.  Upon exsanguination of the right arm with Esmarch bandage, arterial tourniquet was inflated to 220 mmHg. Following a routine surgical time-out, procedure commenced with an oblique incision directly over the mass.  Subcutaneous tissues were carefully divided, taken care to identify the radial neurovascular bundle and place a blunt Ragnell retractors safely retracting the neurovascular structures.  A total of 3 retractors were placed allowing visualization of an 8-mm diameter cyst.  This was forming directly on the proximal aspect of the A2 pulley.  The cyst was excised completely as well as the superficial portion of the pulley.  There were no significant bleeding issues.  The wound was repaired with intradermal 4- 0 Prolene.  For aftercare, Mr. Fuston is placed in dressing of Xeroflo, sterile gauze, and Coban.  He may advance to Band-Aids after several days.  We will see him back for followup in our office in 1 week for suture removal.     Katy Fitch. Khayman Kirsch, M.D.     RVS/MEDQ  D:  04/22/2012  T:  04/23/2012  Job:  [id number]  cc:   Bryan Lemma. Manus Gunning, M.D.

## 2012-08-23 ENCOUNTER — Other Ambulatory Visit: Payer: BC Managed Care – PPO | Admitting: Lab

## 2012-08-23 DIAGNOSIS — C189 Malignant neoplasm of colon, unspecified: Secondary | ICD-10-CM

## 2012-08-23 LAB — CEA: CEA: 0.9 ng/mL (ref 0.0–5.0)

## 2012-08-26 ENCOUNTER — Ambulatory Visit (HOSPITAL_BASED_OUTPATIENT_CLINIC_OR_DEPARTMENT_OTHER): Payer: BC Managed Care – PPO | Admitting: Nurse Practitioner

## 2012-08-26 ENCOUNTER — Telehealth: Payer: Self-pay | Admitting: Oncology

## 2012-08-26 VITALS — BP 136/81 | HR 63 | Temp 97.2°F | Resp 18 | Ht 70.0 in | Wt 185.3 lb

## 2012-08-26 DIAGNOSIS — C187 Malignant neoplasm of sigmoid colon: Secondary | ICD-10-CM

## 2012-08-26 DIAGNOSIS — C189 Malignant neoplasm of colon, unspecified: Secondary | ICD-10-CM

## 2012-08-26 DIAGNOSIS — R911 Solitary pulmonary nodule: Secondary | ICD-10-CM

## 2012-08-26 NOTE — Progress Notes (Signed)
OFFICE PROGRESS NOTE  Interval history:  Derek Crosby returns as scheduled. He feels well. No interim illnesses or infections. Bowels moving regularly. No bloody or black stools. He reports undergoing a colonoscopy over the summer with findings of 2 "precancerous polyps" in the ascending colon. He has a good appetite. No nausea or vomiting. No numbness or tingling in the fingertips and toes. He intermittently notes mild difficulty with small buttons. He has occasional discomfort at the right abdomen.   Objective: Blood pressure 136/81, pulse 63, temperature 97.2 F (36.2 C), temperature source Oral, resp. rate 18, height 5\' 10"  (1.778 m), weight 185 lb 4.8 oz (84.052 kg).  Oropharynx is without thrush or ulceration. No palpable cervical, supraclavicular, axillary or inguinal lymph nodes. Lungs are clear. No wheezes or rales. Regular cardiac rhythm. Abdomen soft and nontender. No hepatomegaly. Extremities are without edema.  Lab Results: Lab Results  Component Value Date   WBC 4.8 02/17/2012   HGB 16.3 04/22/2012   HCT 47.9 02/17/2012   MCV 92.1 02/17/2012   PLT 140 02/17/2012    Chemistry:    Chemistry      Component Value Date/Time   NA 138 02/17/2012 0829   NA 142 11/11/2011 1000   K 4.5 02/17/2012 0829   K 4.3 11/11/2011 1000   CL 100 02/17/2012 0829   CL 105 11/11/2011 1000   CO2 29 02/17/2012 0829   CO2 27 11/11/2011 1000   BUN 18 02/17/2012 0829   BUN 16 11/11/2011 1000   CREATININE 1.2 02/17/2012 0829   CREATININE 1.09 11/11/2011 1000      Component Value Date/Time   CALCIUM 9.1 02/17/2012 0829   CALCIUM 9.1 11/11/2011 1000   ALKPHOS 59 02/17/2012 0829   ALKPHOS 60 11/11/2011 1000   AST 31 02/17/2012 0829   AST 26 11/11/2011 1000   ALT 22 11/11/2011 1000   BILITOT 0.80 02/17/2012 0829   BILITOT 0.9 11/11/2011 1000     08/23/2012 CEA 0.9.  Studies/Results: No results found.  Medications: I have reviewed the patient's current medications.  Assessment/Plan:  1. Stage III (pT2 pN1a) adenocarcinoma  of the sigmoid colon, status post a sigmoid colectomy with primary anastomosis 03/25/2011. Microsatellite instability testing by immunohistochemistry confirmed a microsatellite stable tumor. He began adjuvant FOLFOX chemotherapy 05/21/2011. He completed cycle #7 on 08/14/2011. Oxaliplatin was held with cycle #6 due to thrombocytopenia. The oxaliplatin was dose reduced by 20% with cycle #7. He completed cycle #12 on 11/05/2011. Restaging CT on 02/17/2012 without evidence of recurrent disease. 2. Delayed nausea, improved with the addition of Aloxi. 3. Status post Port-A-Cath placement 05/20/2011. 4. 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 5. Kidney stone in 2009. 6. L5-S1 disk surgery in 1997. 7. Low heart rate, likely due to his young age and physical conditioning. 8. History of thrombocytopenia secondary to chemotherapy-resolved. 9. History of abnormal sensation with swallowing, likely related to oxaliplatin. 10. 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 The numbness and tingling in the fingers and toes has resolved. 11. Mucositis following cycle 10 FOLFOX-resolved. 12. Allergic reaction to oxaliplatin on 10/22/2011-hives that resolved with Benadryl. 13. Right lung nodule-stable on the CT 02/17/2012. 14. Colonoscopy summer 2013. We will obtain the colonoscopy report.  Disposition-Derek Crosby appears stable. He remains in clinical remission from colon cancer. He will return for a CEA, CT scans, and an office visit in 6 months. He will contact the office in the interim with any problems.  Plan reviewed with Dr. Truett Perna.  Lonna Cobb ANP/GNP-BC

## 2012-08-26 NOTE — Telephone Encounter (Signed)
gv and printed appt schedule for pt for June 2014...gv pt barium..the patient aware central scheduling will contact with d/t of ct.

## 2012-09-03 IMAGING — RF IR CV CATH INJECTION
3 series · 3 of 3 positions shown · non-contrast
Comparison: Placement film of 05/20/2011

CLINICAL DATA: Pain at Port-A-Cath site during chemo infusion

CV CATH INJECTION

[Series 1: run · 1 of 1 slices shown (1 of 3)]
[im 1/1]
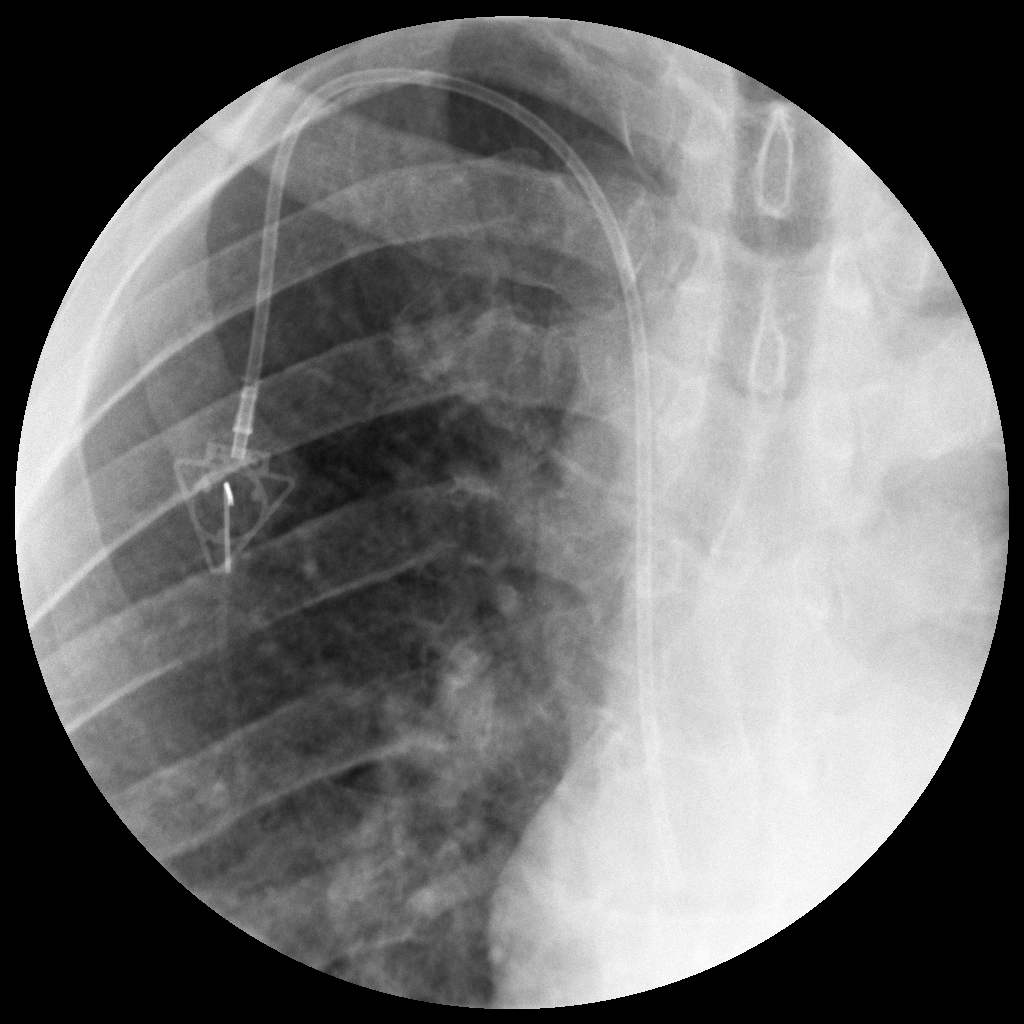

[Series 2: run · 1 of 1 slices shown (2 of 3)]
[im 1/1]
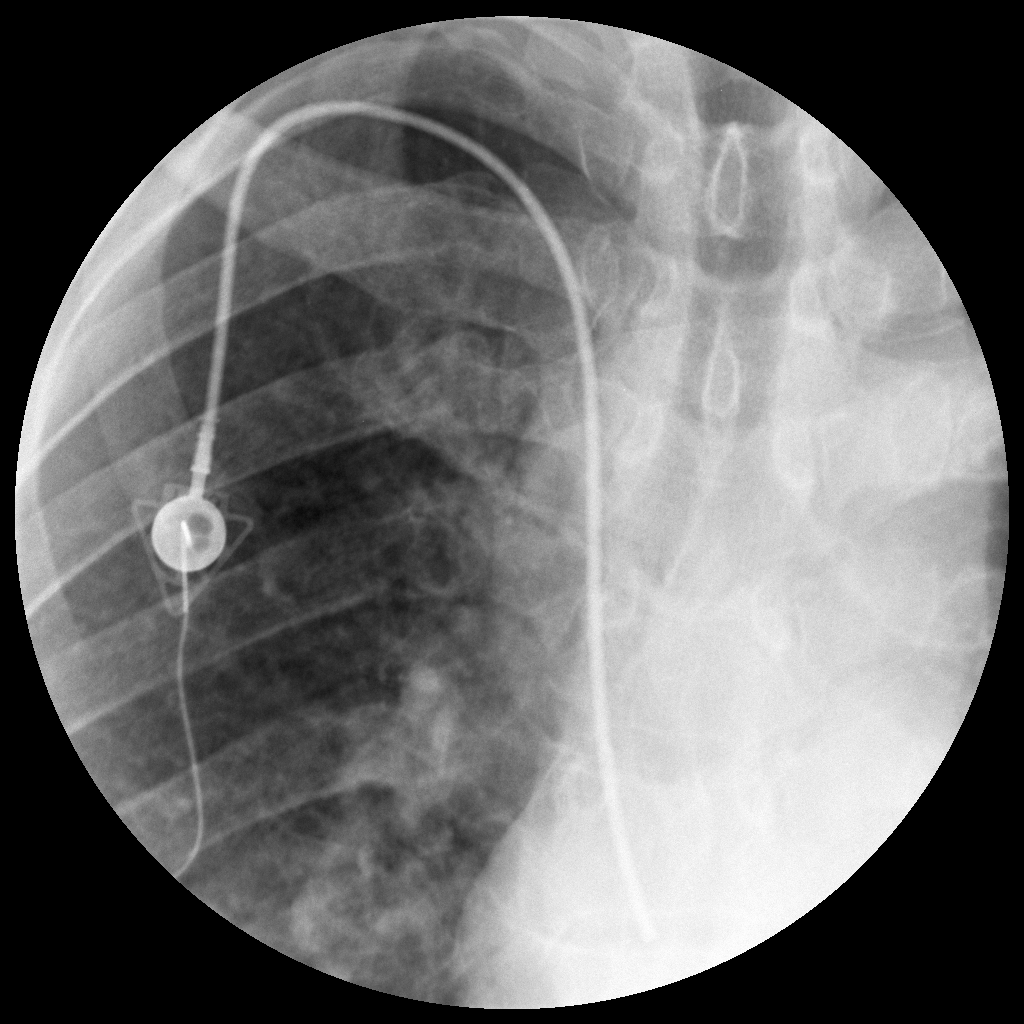

[Series 3: run · 1 of 1 slices shown (3 of 3)]
[im 1/1]
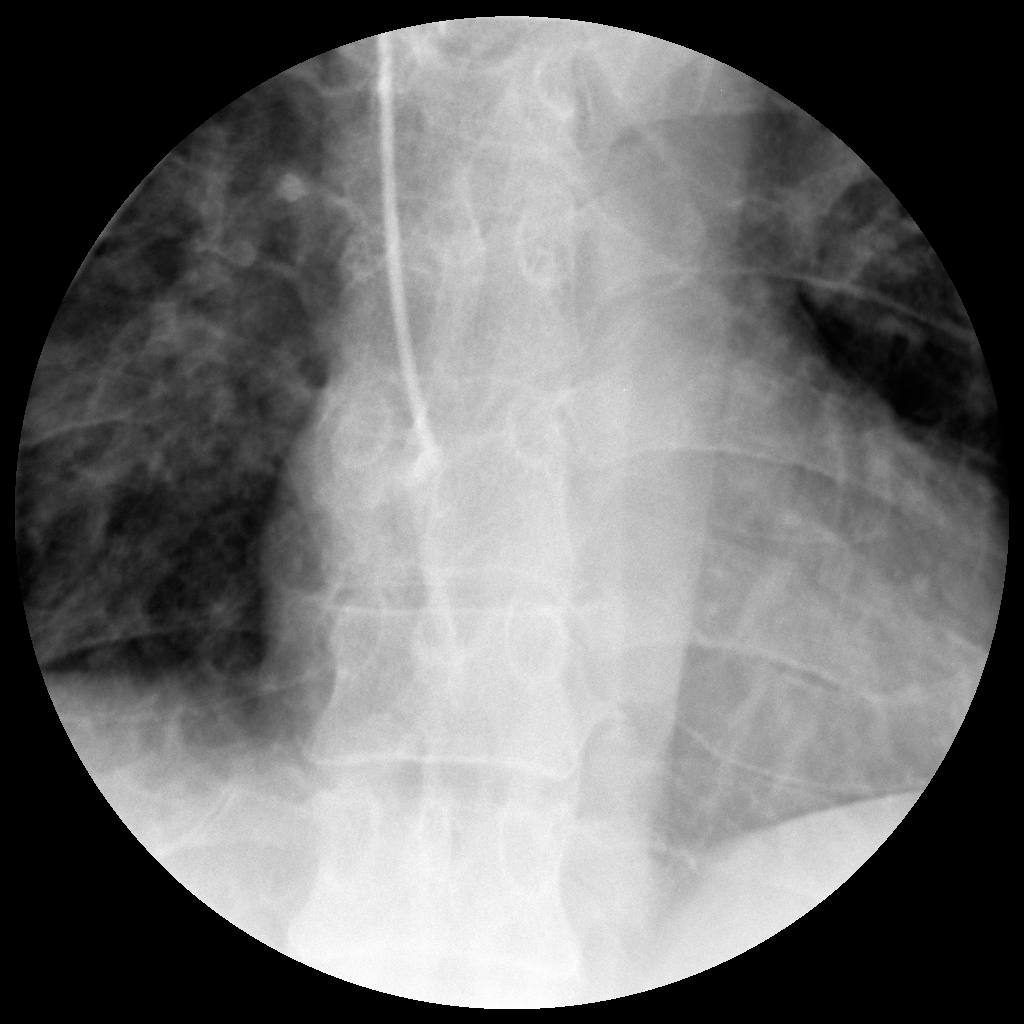

[3 of 3 positions shown; findings below may reference images not displayed]

FINDINGS: The Port-A-Cath is in the right chest unchanged in position.  Port-
A-Cath was hand injected with approximately 18 ml of Amnipaque-7EE.
No resistance to injection.  There is no leak at the Port-A-Cath
hub.  No leak along the course of the catheter.  Contrast flows
freely through the catheter tip in the distal SVC without evidence
obstruction. The patient did experience mild pain in the chest wall
at port site similar to the pain at infusion.
IMPRESSION: Normal functioning Port-A-Cath.

## 2013-02-21 ENCOUNTER — Encounter (HOSPITAL_COMMUNITY): Payer: Self-pay

## 2013-02-21 ENCOUNTER — Ambulatory Visit (HOSPITAL_COMMUNITY)
Admission: RE | Admit: 2013-02-21 | Discharge: 2013-02-21 | Disposition: A | Discharge status: Home / Self Care | Payer: BC Managed Care – PPO | Source: Ambulatory Visit | Attending: Nurse Practitioner | Admitting: Nurse Practitioner

## 2013-02-21 ENCOUNTER — Other Ambulatory Visit (HOSPITAL_BASED_OUTPATIENT_CLINIC_OR_DEPARTMENT_OTHER): Payer: BC Managed Care – PPO | Admitting: Lab

## 2013-02-21 DIAGNOSIS — C189 Malignant neoplasm of colon, unspecified: Secondary | ICD-10-CM

## 2013-02-21 DIAGNOSIS — Z9221 Personal history of antineoplastic chemotherapy: Secondary | ICD-10-CM | POA: Insufficient documentation

## 2013-02-21 DIAGNOSIS — I2510 Atherosclerotic heart disease of native coronary artery without angina pectoris: Secondary | ICD-10-CM | POA: Insufficient documentation

## 2013-02-21 LAB — CBC WITH DIFFERENTIAL/PLATELET
BASO%: 0.4 % (ref 0.0–2.0)
Eosinophils Absolute: 0.1 10*3/uL (ref 0.0–0.5)
LYMPH%: 30.9 % (ref 14.0–49.0)
MCHC: 35.6 g/dL (ref 32.0–36.0)
MONO#: 0.4 10*3/uL (ref 0.1–0.9)
MONO%: 8.2 % (ref 0.0–14.0)
NEUT#: 2.7 10*3/uL (ref 1.5–6.5)
Platelets: 146 10*3/uL (ref 140–400)
RBC: 5.23 10*6/uL (ref 4.20–5.82)
RDW: 13 % (ref 11.0–14.6)
WBC: 4.6 10*3/uL (ref 4.0–10.3)

## 2013-02-21 LAB — COMPREHENSIVE METABOLIC PANEL (CC13)
ALT: 21 U/L (ref 0–55)
AST: 25 U/L (ref 5–34)
Chloride: 105 mEq/L (ref 98–107)
Creatinine: 1 mg/dL (ref 0.7–1.3)
Sodium: 140 mEq/L (ref 136–145)
Total Bilirubin: 0.55 mg/dL (ref 0.20–1.20)
Total Protein: 7.4 g/dL (ref 6.4–8.3)

## 2013-02-21 LAB — CEA: CEA: 1.3 ng/mL (ref 0.0–5.0)

## 2013-02-21 MED ORDER — IOHEXOL 300 MG/ML  SOLN
100.0000 mL | Freq: Once | INTRAMUSCULAR | Status: AC | PRN
  Administered 2013-02-21: 100 mL via INTRAVENOUS

## 2013-02-23 ENCOUNTER — Ambulatory Visit (HOSPITAL_BASED_OUTPATIENT_CLINIC_OR_DEPARTMENT_OTHER): Payer: BC Managed Care – PPO | Admitting: Oncology

## 2013-02-23 ENCOUNTER — Telehealth: Payer: Self-pay | Admitting: Oncology

## 2013-02-23 VITALS — BP 133/81 | HR 51 | Temp 97.2°F | Resp 18 | Ht 70.0 in | Wt 187.6 lb

## 2013-02-23 DIAGNOSIS — C189 Malignant neoplasm of colon, unspecified: Secondary | ICD-10-CM

## 2013-02-23 DIAGNOSIS — C187 Malignant neoplasm of sigmoid colon: Secondary | ICD-10-CM

## 2013-02-23 NOTE — Telephone Encounter (Signed)
gv and printed appt sched and avs for pt  °

## 2013-02-23 NOTE — Progress Notes (Signed)
   Derek Crosby    OFFICE PROGRESS NOTE   INTERVAL HISTORY:   Derek Crosby returns as scheduled. Derek Crosby feels well. Good appetite. No difficulty with bowel function. No neuropathy symptoms. Derek Crosby reports discomfort at the left shoulder for the past 3 months. Derek Crosby has occasional neck pain and "tingling" in the arm. No arm or hand weakness.  Objective:  Vital signs in last 24 hours:  Blood pressure 133/81, pulse 51, temperature 97.2 F (36.2 C), temperature source Oral, resp. rate 18, height 5\' 10"  (1.778 m), weight 187 lb 9.6 oz (85.095 kg).    HEENT: Neck without mass or tenderness Lymphatics: No cervical, supraclavicular, axillary, or inguinal nodes Resp: Lungs clear bilaterally Cardio: Regular rate and rhythm GI: No hepatomegaly, nontender, no mass Vascular: No leg edema  Muscle skeletal: no pain with motion at the left shoulder. The left arm and hand strength appear intact. No neck tenderness.     Lab Results:  Lab Results  Component Value Date   WBC 4.6 02/21/2013   HGB 16.7 02/21/2013   HCT 47.0 02/21/2013   MCV 89.8 02/21/2013   PLT 146 02/21/2013   CEA on 16 2014-1.3  X-rays: CTs of the chest, abdomen, and pelvis on 02/21/2013-no metastatic disease in the chest. No evidence of local recurrence or metastatic disease in the abdomen or pelvis. Multiple hepatic and left renal lesions most consistent with cysts   Medications: I have reviewed the patient's current medications.  Assessment/Plan: 1. Stage III (pT2 pN1a) adenocarcinoma of the sigmoid colon, status post a sigmoid colectomy with primary anastomosis 03/25/2011. Microsatellite instability testing by immunohistochemistry confirmed a microsatellite stable tumor. Derek Crosby began adjuvant FOLFOX chemotherapy 05/21/2011. Derek Crosby completed cycle #7 on 08/14/2011. Oxaliplatin was held with cycle #6 due to thrombocytopenia. The oxaliplatin was dose reduced by 20% with cycle #7. Derek Crosby completed cycle #12 on 11/05/2011. Restaging CT on  02/17/2012 without evidence of recurrent disease. Restaging CTs 02/21/2013 without evidence of recurrent disease 2. Delayed nausea, improved with the addition of Aloxi. 3. Status post Port-A-Cath placement 05/20/2011. 4. 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 5. Kidney stone in 2009. 6. L5-S1 disk surgery in 1997. 7. Low heart rate, likely due to his young age and physical conditioning. 8. History of thrombocytopenia secondary to chemotherapy-resolved. 9. History of abnormal sensation with swallowing, likely related to oxaliplatin. 10. 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 The numbness and tingling in the fingers and toes has resolved. 11. Mucositis following cycle 10 FOLFOX-resolved. 12. Allergic reaction to oxaliplatin on 10/22/2011-hives that resolved with Benadryl. 13. Right lung nodule-stable on the CT 02/21/2013 14. Colonoscopy 03/03/2012. Status post removal of polyps (tubular adenoma) from the ascending colon. Derek Crosby will be scheduled for a repeat colonoscopy within the next few months  Disposition:  Derek Crosby remains in clinical remission from colon cancer. Derek Crosby will return for an office visit and CEA in 6 months. Derek Crosby plans to followup with Dr. Manus Gunning to evaluate the shoulder discomfort.   Thornton Papas, MD  02/23/2013  5:12 PM

## 2013-03-03 NOTE — Telephone Encounter (Signed)
error 

## 2013-08-07 ENCOUNTER — Other Ambulatory Visit: Payer: Self-pay | Admitting: Physician Assistant

## 2013-08-07 DIAGNOSIS — M545 Low back pain, unspecified: Secondary | ICD-10-CM

## 2013-08-13 ENCOUNTER — Ambulatory Visit
Admission: RE | Admit: 2013-08-13 | Discharge: 2013-08-13 | Disposition: A | Discharge status: Home / Self Care | Payer: BC Managed Care – PPO | Source: Ambulatory Visit | Attending: Physician Assistant | Admitting: Physician Assistant

## 2013-08-13 DIAGNOSIS — M545 Low back pain, unspecified: Secondary | ICD-10-CM

## 2013-08-24 ENCOUNTER — Ambulatory Visit (HOSPITAL_BASED_OUTPATIENT_CLINIC_OR_DEPARTMENT_OTHER): Payer: BC Managed Care – PPO | Admitting: Nurse Practitioner

## 2013-08-24 ENCOUNTER — Encounter (INDEPENDENT_AMBULATORY_CARE_PROVIDER_SITE_OTHER): Payer: Self-pay

## 2013-08-24 ENCOUNTER — Other Ambulatory Visit: Payer: BC Managed Care – PPO

## 2013-08-24 VITALS — BP 163/99 | HR 80 | Temp 97.0°F | Resp 20 | Ht 70.0 in | Wt 183.8 lb

## 2013-08-24 DIAGNOSIS — C189 Malignant neoplasm of colon, unspecified: Secondary | ICD-10-CM

## 2013-08-24 DIAGNOSIS — C187 Malignant neoplasm of sigmoid colon: Secondary | ICD-10-CM

## 2013-08-24 DIAGNOSIS — M545 Low back pain, unspecified: Secondary | ICD-10-CM

## 2013-08-24 DIAGNOSIS — M79609 Pain in unspecified limb: Secondary | ICD-10-CM

## 2013-08-24 NOTE — Progress Notes (Signed)
Called [facility] to have patients last Colonoscopy results faxed to office.  Per Ebbie Ridge. Maisie Fus, NP.

## 2013-08-24 NOTE — Progress Notes (Signed)
OFFICE PROGRESS NOTE  Interval history:  Derek Crosby returns for scheduled followup of colon cancer. No change in bowel habits. No bloody or black stools. He denies abdominal pain. No nausea or vomiting. He has a good appetite. No neuropathy symptoms. He denies shortness of breath.  His only complaint today is low back and right leg pain. MRI of the lumbar spine on 08/13/2013 showed recurrent right lateral recess disc protrusion at L5-S1 causing moderate to prominent impingement on the right S1 nerve roots. There was also mild impingement at L3-4 and L4-5 due to spondylosis and degenerative disc disease. He has an appointment with his neurosurgeon next week.   Objective: Blood pressure 163/99, pulse 80, temperature 97 F (36.1 C), temperature source Oral, resp. rate 20, height 5\' 10"  (1.778 m), weight 183 lb 12.8 oz (83.371 kg).  Oropharynx is without thrush or ulceration. No palpable cervical, supraclavicular, axillary or inguinal lymph nodes. Lungs are clear. Regular cardiac rhythm. Abdomen soft and nontender. No hepatomegaly. No mass. No leg edema.  Lab Results: Lab Results  Component Value Date   WBC 4.6 02/21/2013   HGB 16.7 02/21/2013   HCT 47.0 02/21/2013   MCV 89.8 02/21/2013   PLT 146 02/21/2013    Chemistry:    Chemistry      Component Value Date/Time   NA 140 02/21/2013 0821   NA 138 02/17/2012 0829   NA 142 11/11/2011 1000   K 4.0 02/21/2013 0821   K 4.5 02/17/2012 0829   K 4.3 11/11/2011 1000   CL 105 02/21/2013 0821   CL 100 02/17/2012 0829   CL 105 11/11/2011 1000   CO2 24 02/21/2013 0821   CO2 29 02/17/2012 0829   CO2 27 11/11/2011 1000   BUN 16.6 02/21/2013 0821   BUN 18 02/17/2012 0829   BUN 16 11/11/2011 1000   CREATININE 1.0 02/21/2013 0821   CREATININE 1.2 02/17/2012 0829   CREATININE 1.09 11/11/2011 1000      Component Value Date/Time   CALCIUM 9.4 02/21/2013 0821   CALCIUM 9.1 02/17/2012 0829   CALCIUM 9.1 11/11/2011 1000   ALKPHOS 66 02/21/2013 0821   ALKPHOS 59 02/17/2012 0829    ALKPHOS 60 11/11/2011 1000   AST 25 02/21/2013 0821   AST 31 02/17/2012 0829   AST 26 11/11/2011 1000   ALT 21 02/21/2013 0821   ALT 26 02/17/2012 0829   ALT 22 11/11/2011 1000   BILITOT 0.55 02/21/2013 0821   BILITOT 0.80 02/17/2012 0829   BILITOT 0.9 11/11/2011 1000       Studies/Results: Mr Lumbar Spine Wo Contrast  08/13/2013   CLINICAL DATA:  Back pain. Right leg pain. Prior micro discectomy in 1998.  EXAM: MRI LUMBAR SPINE WITHOUT CONTRAST  TECHNIQUE: Multiplanar, multisequence MR imaging was performed. No intravenous contrast was administered.  COMPARISON:  02/21/2013  FINDINGS: The lowest lumbar type non-rib-bearing vertebra is labeled as L5. The conus medullaris appears normal. Conus level: L1.  There is 4 mm degenerative posterior subluxation at L5-S1 with a Schmorl's node along the inferior endplate of L5. Intervertebral disc desiccation is observed at all levels between L3 and S1.  Additional findings at individual levels are as follows:  L1-2: Unremarkable.  L2-3:  No impingement.  Mild disc bulge.  L3-4: Mild left subarticular lateral recess stenosis due to a left lateral recess disc protrusion.  L4-5: Mild bilateral subarticular lateral recess stenosis due to a slightly left paracentral disc protrusion superimposed on disc bulge.  L5-S1: Postoperative findings from prior  right hemilaminectomy. Right lateral recess recurrent disc protrusion posteriorly displaces the right S1 nerve roots compatible with moderate to prominent impingement. There is also borderline left subarticular lateral recess stenosis.  IMPRESSION: 1. Recurrent right lateral recess disc protrusion at L5-S1 causes moderate to prominent impingement on the right S1 nerve roots. Given the deviation of the nerve roots, I strongly favor recurrent disc protrusion over epidural fibrosis. 2. There is also mild impingement at L3-4 and L4-5 due to spondylosis and degenerative disc disease as detailed above.   Electronically Signed   By:  Herbie Baltimore M.D.   On: 08/13/2013 20:26    Medications: I have reviewed the patient's current medications.  Assessment/Plan:  1. Stage III (pT2 pN1a) adenocarcinoma of the sigmoid colon, status post a sigmoid colectomy with primary anastomosis 03/25/2011. Microsatellite instability testing by immunohistochemistry confirmed a microsatellite stable tumor. He began adjuvant FOLFOX chemotherapy 05/21/2011. He completed cycle #7 on 08/14/2011. Oxaliplatin was held with cycle #6 due to thrombocytopenia. The oxaliplatin was dose reduced by 20% with cycle #7. He completed cycle #12 on 11/05/2011. Restaging CT on 02/17/2012 without evidence of recurrent disease. Restaging CTs 02/21/2013 without evidence of recurrent disease 2. Delayed nausea, improved with the addition of Aloxi. 3. Status post Port-A-Cath placement 05/20/2011. 4. 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 5. Kidney stone in 2009. 6. L5-S1 disk surgery in 1997. 7. Low heart rate, likely due to his young age and physical conditioning. 8. History of thrombocytopenia secondary to chemotherapy-resolved. 9. History of abnormal sensation with swallowing, likely related to oxaliplatin. 10. 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 The numbness and tingling in the fingers and toes has resolved. 11. Mucositis following cycle 10 FOLFOX-resolved. 12. Allergic reaction to oxaliplatin on 10/22/2011-hives that resolved with Benadryl. 13. Right lung nodule-stable on the CT 02/21/2013 14. Colonoscopy 03/03/2012. Status post removal of polyps (tubular adenoma) from the ascending colon.  15. Colonoscopy 06/08/2013. Internal hemorrhoids. Diverticulosis in the sigmoid colon and in the ascending colon. Examination otherwise normal. Next colonoscopy in 3 years. 16. Back and right leg pain with MRI 08/13/2013 showing disc protrusion at  L5-S1 causing moderate to prominent impingement on the right S1 nerve roots; also mild impingement at L3-4 and L4-5 due to spondylosis and degenerative disc disease.  Disposition-he remains in clinical remission from colon cancer. We will followup on the CEA from today. We are referring him for restaging CT scans in 6 months. He will be seen in followup one week after the scans to review the results. He will contact the office in the interim with any problems.  Plan reviewed with Dr. Truett Perna.  Lonna Cobb ANP/GNP-BC

## 2013-08-25 ENCOUNTER — Telehealth: Payer: Self-pay | Admitting: Oncology

## 2013-08-25 ENCOUNTER — Telehealth: Payer: Self-pay | Admitting: *Deleted

## 2013-08-25 NOTE — Telephone Encounter (Signed)
s.w. pt and advised on June 2015 appts...pt aware to pick up barium

## 2013-08-25 NOTE — Telephone Encounter (Signed)
Message copied by Caleb Popp on Fri Aug 25, 2013 10:26 AM ------      Message from: Thornton Papas B      Created: Thu Aug 24, 2013  8:08 PM       Please call patient, cea is normal ------

## 2013-08-25 NOTE — Telephone Encounter (Signed)
Called pt with lab results. CEA normal at 0.8. He voiced understanding.

## 2013-09-11 ENCOUNTER — Other Ambulatory Visit: Payer: Self-pay | Admitting: Neurosurgery

## 2013-09-18 ENCOUNTER — Encounter (HOSPITAL_COMMUNITY): Payer: Self-pay

## 2013-09-22 NOTE — Pre-Procedure Instructions (Signed)
Derek Crosby  09/22/2013   Your procedure is scheduled on:  Tuesday October 03, 2013 @ 1:33 PM.  Report to [facility] Short Stay Entrance "A" Admitting at 10:30 AM.  Call this number if you have problems the morning of surgery: [phone]   Remember:   Do not eat food or drink liquids after midnight.   Take these medicines the morning of surgery with A SIP OF WATER: None   Discontinue aspirin and herbal medications 7 days prior to surgery   Do not wear jewelry.  Do not wear lotions, powders, or colognes. You may wear deodorant.  Men may shave face and neck.  Do not bring valuables to the hospital.  [facility] is not responsible for any belongings or valuables.               Contacts, dentures or bridgework may not be worn into surgery.  Leave suitcase in the car. After surgery it may be brought to your room.  For patients admitted to the hospital, discharge time is determined by your treatment team.               Patients discharged the day of surgery will not be allowed to drive home.  Name and phone number of your driver: Family/Friend  Special Instructions: Shower using CHG 2 nights before surgery and the night before surgery.  If you shower the day of surgery use CHG.  Use special wash - you have one bottle of CHG for all showers.  You should use approximately 1/3 of the bottle for each shower.   Please read over the following fact sheets that you were given: Pain Booklet, Coughing and Deep Breathing, MRSA Information and Surgical Site Infection Prevention

## 2013-09-25 ENCOUNTER — Encounter (HOSPITAL_COMMUNITY): Payer: Self-pay

## 2013-09-25 ENCOUNTER — Encounter (HOSPITAL_COMMUNITY)
Admission: RE | Admit: 2013-09-25 | Discharge: 2013-09-25 | Disposition: A | Discharge status: Home / Self Care | Payer: BC Managed Care – PPO | Source: Ambulatory Visit | Attending: Neurosurgery | Admitting: Neurosurgery

## 2013-09-25 DIAGNOSIS — Z01818 Encounter for other preprocedural examination: Secondary | ICD-10-CM | POA: Insufficient documentation

## 2013-09-25 DIAGNOSIS — Z01812 Encounter for preprocedural laboratory examination: Secondary | ICD-10-CM | POA: Insufficient documentation

## 2013-09-25 HISTORY — DX: Nausea with vomiting, unspecified: R11.2

## 2013-09-25 HISTORY — DX: Unspecified osteoarthritis, unspecified site: M19.90

## 2013-09-25 HISTORY — DX: Other specified postprocedural states: Z98.890

## 2013-09-25 LAB — CBC
HCT: 45.4 % (ref 39.0–52.0)
HEMOGLOBIN: 16.6 g/dL (ref 13.0–17.0)
MCH: 32.3 pg (ref 26.0–34.0)
MCHC: 36.6 g/dL — AB (ref 30.0–36.0)
MCV: 88.3 fL (ref 78.0–100.0)
Platelets: 202 10*3/uL (ref 150–400)
RBC: 5.14 MIL/uL (ref 4.22–5.81)
RDW: 12.1 % (ref 11.5–15.5)
WBC: 6.5 10*3/uL (ref 4.0–10.5)

## 2013-09-25 LAB — SURGICAL PCR SCREEN
MRSA, PCR: NEGATIVE
Staphylococcus aureus: POSITIVE — AB

## 2013-10-02 MED ORDER — CEFAZOLIN SODIUM-DEXTROSE 2-3 GM-% IV SOLR
2.0000 g | INTRAVENOUS | Status: AC
  Administered 2013-10-03: 2 g via INTRAVENOUS
  Filled 2013-10-02: qty 50

## 2013-10-03 ENCOUNTER — Ambulatory Visit (HOSPITAL_COMMUNITY): Payer: BC Managed Care – PPO

## 2013-10-03 ENCOUNTER — Encounter (HOSPITAL_COMMUNITY)
Admission: RE | Disposition: A | Discharge status: Home / Self Care | Payer: Self-pay | Source: Ambulatory Visit | Attending: Neurosurgery

## 2013-10-03 ENCOUNTER — Ambulatory Visit (HOSPITAL_COMMUNITY)
Admission: RE | Admit: 2013-10-03 | Discharge: 2013-10-04 | Disposition: A | Discharge status: Home / Self Care | Payer: BC Managed Care – PPO | Source: Ambulatory Visit | Attending: Neurosurgery | Admitting: Neurosurgery

## 2013-10-03 ENCOUNTER — Encounter (HOSPITAL_COMMUNITY): Payer: BC Managed Care – PPO | Admitting: Anesthesiology

## 2013-10-03 ENCOUNTER — Encounter (HOSPITAL_COMMUNITY): Payer: Self-pay | Admitting: *Deleted

## 2013-10-03 ENCOUNTER — Ambulatory Visit (HOSPITAL_COMMUNITY): Payer: BC Managed Care – PPO | Admitting: Anesthesiology

## 2013-10-03 DIAGNOSIS — M5126 Other intervertebral disc displacement, lumbar region: Secondary | ICD-10-CM | POA: Diagnosis present

## 2013-10-03 DIAGNOSIS — Z85038 Personal history of other malignant neoplasm of large intestine: Secondary | ICD-10-CM | POA: Insufficient documentation

## 2013-10-03 DIAGNOSIS — Z9049 Acquired absence of other specified parts of digestive tract: Secondary | ICD-10-CM | POA: Insufficient documentation

## 2013-10-03 HISTORY — PX: LUMBAR LAMINECTOMY/DECOMPRESSION MICRODISCECTOMY: SHX5026

## 2013-10-03 SURGERY — LUMBAR LAMINECTOMY/DECOMPRESSION MICRODISCECTOMY 1 LEVEL
Anesthesia: General | Site: Back | Laterality: Right

## 2013-10-03 MED ORDER — METOCLOPRAMIDE HCL 5 MG/ML IJ SOLN: 10.0000 mg | Freq: Once | INTRAMUSCULAR | Status: DC | PRN

## 2013-10-03 MED ORDER — DEXAMETHASONE SODIUM PHOSPHATE 4 MG/ML IJ SOLN
4.0000 mg | Freq: Four times a day (QID) | INTRAMUSCULAR | Status: AC
  Administered 2013-10-03 (×2): 4 mg via INTRAVENOUS
  Filled 2013-10-03 (×2): qty 1

## 2013-10-03 MED ORDER — HYDROMORPHONE HCL PF 1 MG/ML IJ SOLN: 0.2500 mg | INTRAMUSCULAR | Status: DC | PRN

## 2013-10-03 MED ORDER — NEOSTIGMINE METHYLSULFATE 1 MG/ML IJ SOLN
INTRAMUSCULAR | Status: DC | PRN
  Administered 2013-10-03: 3 mg via INTRAVENOUS

## 2013-10-03 MED ORDER — FENTANYL CITRATE 0.05 MG/ML IJ SOLN
INTRAMUSCULAR | Status: DC | PRN
  Administered 2013-10-03: 100 ug via INTRAVENOUS
  Administered 2013-10-03 (×5): 50 ug via INTRAVENOUS

## 2013-10-03 MED ORDER — MENTHOL 3 MG MT LOZG: 1.0000 | LOZENGE | OROMUCOSAL | Status: DC | PRN

## 2013-10-03 MED ORDER — ONDANSETRON HCL 4 MG/2ML IJ SOLN
INTRAMUSCULAR | Status: DC | PRN
  Administered 2013-10-03: 4 mg via INTRAVENOUS

## 2013-10-03 MED ORDER — HYDROCODONE-ACETAMINOPHEN 5-325 MG PO TABS: 1.0000 | ORAL_TABLET | ORAL | Status: DC | PRN

## 2013-10-03 MED ORDER — ROCURONIUM BROMIDE 100 MG/10ML IV SOLN
INTRAVENOUS | Status: DC | PRN
  Administered 2013-10-03: 50 mg via INTRAVENOUS

## 2013-10-03 MED ORDER — LACTATED RINGERS IV SOLN
INTRAVENOUS | Status: DC
  Administered 2013-10-03: 11:00:00 via INTRAVENOUS

## 2013-10-03 MED ORDER — LACTATED RINGERS IV SOLN
INTRAVENOUS | Status: DC | PRN
  Administered 2013-10-03 (×2): via INTRAVENOUS

## 2013-10-03 MED ORDER — PHENYLEPHRINE HCL 10 MG/ML IJ SOLN
INTRAMUSCULAR | Status: DC | PRN
  Administered 2013-10-03 (×2): 40 ug via INTRAVENOUS

## 2013-10-03 MED ORDER — ONDANSETRON HCL 4 MG/2ML IJ SOLN: 4.0000 mg | INTRAMUSCULAR | Status: DC | PRN

## 2013-10-03 MED ORDER — 0.9 % SODIUM CHLORIDE (POUR BTL) OPTIME
TOPICAL | Status: DC | PRN
  Administered 2013-10-03: 1000 mL

## 2013-10-03 MED ORDER — GLYCOPYRROLATE 0.2 MG/ML IJ SOLN
INTRAMUSCULAR | Status: DC | PRN
  Administered 2013-10-03: 0.6 mg via INTRAVENOUS

## 2013-10-03 MED ORDER — BUPIVACAINE HCL (PF) 0.5 % IJ SOLN
INTRAMUSCULAR | Status: DC | PRN
  Administered 2013-10-03: 20 mL

## 2013-10-03 MED ORDER — THROMBIN 5000 UNITS EX SOLR
CUTANEOUS | Status: DC | PRN
  Administered 2013-10-03 (×2): 5000 [IU] via TOPICAL

## 2013-10-03 MED ORDER — HYDROMORPHONE HCL PF 1 MG/ML IJ SOLN: 1.0000 mg | INTRAMUSCULAR | Status: DC | PRN

## 2013-10-03 MED ORDER — ACETAMINOPHEN 650 MG RE SUPP: 650.0000 mg | RECTAL | Status: DC | PRN

## 2013-10-03 MED ORDER — DEXAMETHASONE SODIUM PHOSPHATE 10 MG/ML IJ SOLN
INTRAMUSCULAR | Status: DC | PRN
  Administered 2013-10-03: 10 mg via INTRAVENOUS

## 2013-10-03 MED ORDER — KETOROLAC TROMETHAMINE 30 MG/ML IJ SOLN
INTRAMUSCULAR | Status: DC | PRN
  Administered 2013-10-03: 30 mg via INTRAVENOUS

## 2013-10-03 MED ORDER — ACETAMINOPHEN 325 MG PO TABS: 650.0000 mg | ORAL_TABLET | ORAL | Status: DC | PRN

## 2013-10-03 MED ORDER — LIDOCAINE HCL (CARDIAC) 20 MG/ML IV SOLN
INTRAVENOUS | Status: DC | PRN
  Administered 2013-10-03: 100 mg via INTRAVENOUS

## 2013-10-03 MED ORDER — SODIUM CHLORIDE 0.9 % IJ SOLN
3.0000 mL | INTRAMUSCULAR | Status: DC | PRN
Start: 2013-10-03 — End: 2013-10-04

## 2013-10-03 MED ORDER — SODIUM CHLORIDE 0.9 % IJ SOLN
3.0000 mL | Freq: Two times a day (BID) | INTRAMUSCULAR | Status: DC
  Administered 2013-10-03: 3 mL via INTRAVENOUS

## 2013-10-03 MED ORDER — KCL IN DEXTROSE-NACL 20-5-0.45 MEQ/L-%-% IV SOLN
80.0000 mL/h | INTRAVENOUS | Status: DC
  Filled 2013-10-03 (×3): qty 1000

## 2013-10-03 MED ORDER — ZOLPIDEM TARTRATE 5 MG PO TABS: 5.0000 mg | ORAL_TABLET | Freq: Every evening | ORAL | Status: DC | PRN

## 2013-10-03 MED ORDER — MIDAZOLAM HCL 5 MG/5ML IJ SOLN
INTRAMUSCULAR | Status: DC | PRN
  Administered 2013-10-03: 2 mg via INTRAVENOUS

## 2013-10-03 MED ORDER — PHENOL 1.4 % MT LIQD: 1.0000 | OROMUCOSAL | Status: DC | PRN

## 2013-10-03 MED ORDER — OXYCODONE HCL 5 MG PO TABS: 5.0000 mg | ORAL_TABLET | Freq: Once | ORAL | Status: DC | PRN

## 2013-10-03 MED ORDER — ALBUMIN HUMAN 5 % IV SOLN
INTRAVENOUS | Status: DC | PRN
  Administered 2013-10-03: 15:00:00 via INTRAVENOUS

## 2013-10-03 MED ORDER — PROPOFOL 10 MG/ML IV BOLUS
INTRAVENOUS | Status: DC | PRN
  Administered 2013-10-03: 240 mg via INTRAVENOUS

## 2013-10-03 MED ORDER — KETOROLAC TROMETHAMINE 30 MG/ML IJ SOLN
30.0000 mg | Freq: Four times a day (QID) | INTRAMUSCULAR | Status: DC
  Administered 2013-10-03 – 2013-10-04 (×2): 30 mg via INTRAVENOUS
  Filled 2013-10-03 (×6): qty 1

## 2013-10-03 MED ORDER — ARTIFICIAL TEARS OP OINT
TOPICAL_OINTMENT | OPHTHALMIC | Status: DC | PRN
  Administered 2013-10-03: 1 via OPHTHALMIC

## 2013-10-03 MED ORDER — DEXAMETHASONE 4 MG PO TABS: 4.0000 mg | ORAL_TABLET | Freq: Four times a day (QID) | ORAL | Status: AC

## 2013-10-03 MED ORDER — EPHEDRINE SULFATE 50 MG/ML IJ SOLN
INTRAMUSCULAR | Status: DC | PRN
  Administered 2013-10-03: 5 mg via INTRAVENOUS

## 2013-10-03 MED ORDER — OXYCODONE HCL 5 MG/5ML PO SOLN: 5.0000 mg | Freq: Once | ORAL | Status: DC | PRN

## 2013-10-03 MED ORDER — HEMOSTATIC AGENTS (NO CHARGE) OPTIME
TOPICAL | Status: DC | PRN
  Administered 2013-10-03: 1 via TOPICAL

## 2013-10-03 MED ORDER — CYCLOBENZAPRINE HCL 10 MG PO TABS: 10.0000 mg | ORAL_TABLET | Freq: Three times a day (TID) | ORAL | Status: DC | PRN

## 2013-10-03 MED ORDER — SODIUM CHLORIDE 0.9 % IR SOLN
Status: DC | PRN
  Administered 2013-10-03: 14:00:00

## 2013-10-03 MED ORDER — CEFAZOLIN SODIUM-DEXTROSE 2-3 GM-% IV SOLR
2.0000 g | Freq: Three times a day (TID) | INTRAVENOUS | Status: AC
  Administered 2013-10-03 – 2013-10-04 (×2): 2 g via INTRAVENOUS
  Filled 2013-10-03 (×2): qty 50

## 2013-10-03 SURGICAL SUPPLY — 48 items
ADH SKN CLS APL DERMABOND .7 (GAUZE/BANDAGES/DRESSINGS) ×1
APL SKNCLS STERI-STRIP NONHPOA (GAUZE/BANDAGES/DRESSINGS) ×1
BAG DECANTER FOR FLEXI CONT (MISCELLANEOUS) ×2 IMPLANT
BENZOIN TINCTURE PRP APPL 2/3 (GAUZE/BANDAGES/DRESSINGS) ×3 IMPLANT
BLADE SURG ROTATE 9660 (MISCELLANEOUS) ×2 IMPLANT
BRUSH SCRUB EZ PLAIN DRY (MISCELLANEOUS) ×2 IMPLANT
BUR CUTTER 7.0 ROUND (BURR) ×2 IMPLANT
BUR MATCHSTICK NEURO 3.0 LAGG (BURR) ×2 IMPLANT
CANISTER SUCT 3000ML (MISCELLANEOUS) ×2 IMPLANT
CONT SPEC 4OZ CLIKSEAL STRL BL (MISCELLANEOUS) ×2 IMPLANT
DERMABOND ADVANCED (GAUZE/BANDAGES/DRESSINGS) ×1
DERMABOND ADVANCED .7 DNX12 (GAUZE/BANDAGES/DRESSINGS) ×1 IMPLANT
DRAPE LAPAROTOMY 100X72X124 (DRAPES) ×2 IMPLANT
DRAPE MICROSCOPE ZEISS OPMI (DRAPES) ×2 IMPLANT
DRAPE SURG 17X23 STRL (DRAPES) ×4 IMPLANT
DRESSING TELFA 8X3 (GAUZE/BANDAGES/DRESSINGS) ×2 IMPLANT
DRSG OPSITE POSTOP 3X4 (GAUZE/BANDAGES/DRESSINGS) ×1 IMPLANT
DURAPREP 26ML APPLICATOR (WOUND CARE) ×2 IMPLANT
ELECT REM PT RETURN 9FT ADLT (ELECTROSURGICAL) ×2
ELECTRODE REM PT RTRN 9FT ADLT (ELECTROSURGICAL) ×1 IMPLANT
GAUZE SPONGE 4X4 16PLY XRAY LF (GAUZE/BANDAGES/DRESSINGS) IMPLANT
GLOVE ECLIPSE 7.5 STRL STRAW (GLOVE) ×4 IMPLANT
GLOVE ECLIPSE 8.0 STRL XLNG CF (GLOVE) ×2 IMPLANT
GOWN BRE IMP SLV AUR LG STRL (GOWN DISPOSABLE) IMPLANT
GOWN BRE IMP SLV AUR XL STRL (GOWN DISPOSABLE) ×1 IMPLANT
GOWN STRL REIN 2XL LVL4 (GOWN DISPOSABLE) IMPLANT
GOWN STRL REUS W/ TWL LRG LVL3 (GOWN DISPOSABLE) IMPLANT
GOWN STRL REUS W/TWL 2XL LVL3 (GOWN DISPOSABLE) ×1 IMPLANT
GOWN STRL REUS W/TWL LRG LVL3 (GOWN DISPOSABLE) ×4
KIT BASIN OR (CUSTOM PROCEDURE TRAY) ×2 IMPLANT
KIT ROOM TURNOVER OR (KITS) ×2 IMPLANT
NDL SPNL 22GX3.5 QUINCKE BK (NEEDLE) ×2 IMPLANT
NEEDLE HYPO 22GX1.5 SAFETY (NEEDLE) ×2 IMPLANT
NEEDLE SPNL 22GX3.5 QUINCKE BK (NEEDLE) ×4 IMPLANT
NS IRRIG 1000ML POUR BTL (IV SOLUTION) ×2 IMPLANT
PACK LAMINECTOMY NEURO (CUSTOM PROCEDURE TRAY) ×2 IMPLANT
PAD ARMBOARD 7.5X6 YLW CONV (MISCELLANEOUS) ×6 IMPLANT
PATTIES SURGICAL .75X.75 (GAUZE/BANDAGES/DRESSINGS) ×2 IMPLANT
RUBBERBAND STERILE (MISCELLANEOUS) ×4 IMPLANT
SPONGE GAUZE 4X4 12PLY (GAUZE/BANDAGES/DRESSINGS) ×2 IMPLANT
SPONGE SURGIFOAM ABS GEL SZ50 (HEMOSTASIS) ×2 IMPLANT
STRIP CLOSURE SKIN 1/2X4 (GAUZE/BANDAGES/DRESSINGS) ×2 IMPLANT
SUT VIC AB 2-0 OS6 18 (SUTURE) ×6 IMPLANT
SUT VIC AB 3-0 CP2 18 (SUTURE) ×2 IMPLANT
SYR 20ML ECCENTRIC (SYRINGE) ×2 IMPLANT
TOWEL OR 17X24 6PK STRL BLUE (TOWEL DISPOSABLE) ×2 IMPLANT
TOWEL OR 17X26 10 PK STRL BLUE (TOWEL DISPOSABLE) ×2 IMPLANT
WATER STERILE IRR 1000ML POUR (IV SOLUTION) ×2 IMPLANT

## 2013-10-03 NOTE — H&P (Signed)
Derek Crosby is an 53 y.o. male.   Chief Complaint: Right leg pain HPI: The patient is a 53 year old gentleman who had back surgery 1997 for herniated disc L5-S1 and did very well at that time. Over the last 5 weeks he has developed recurrent right leg pain. There is no onset of an excessively worse. Cells medical Dr. Hinton Dyer steroid shot and prednisone orally which gave him some mild relief. He been taking ibuprofen around the clock but the pain has been getting steadily worse. As he has difficulty standing on his toes on the right side of the pain is still quite severe. Numerous skin was then now comes for evaluation. After evaluation the office was not the patient is a recurrent disc herniation L5-S1 on the right. After discussing the options the patient requested surgery now comes for a right L5-S1 redo microdiscectomy. I had a long discussion with him The risks and benefits of surgical intervention. The risks discussed include but aren't limited to bleeding infection weakness numbness paralysis spinal fluid leak coma and death. We've discussed alternative methods of therapy along with risks and benefits of nonintervention. And he's had the opportunity to ask numerous questions and appears to understand. With this information in hand he has requested we proceed with surgery.  Past Medical History  Diagnosis Date  . colon ca dx'd 02/05/11  . PONV (postoperative nausea and vomiting)   . Arthritis     Past Surgical History  Procedure Laterality Date  . Partial colectomy  2012    sigmoid-cancer  . Port-a-cath removal  2012    insertion and out  . Cystoscopy/retrograde/ureteroscopy  2009    jj stent-rt-kidney stone  . Microdiscectomy lumbar  1997  . Mass excision  04/22/2012    Procedure: EXCISION MASS;  Surgeon: Cammie Sickle., MD;  Location: [place];  Service: Orthopedics;  Laterality: Right;  Right ring cyst excision  . Lithotripsy      History reviewed. No pertinent  family history. Social History:  reports that he has never smoked. He has never used smokeless tobacco. He reports that he drinks alcohol. He reports that he does not use illicit drugs.  Allergies:  Allergies  Allergen Reactions  . Oxaliplatin Hives    Medications Prior to Admission  Medication Sig Dispense Refill  . acetaminophen (TYLENOL) 500 MG tablet Take 1,000 mg by mouth every 6 (six) hours as needed for mild pain.      . cholecalciferol (VITAMIN D) 1000 UNITS tablet Take 2,000 Units by mouth daily.       . fish oil-omega-3 fatty acids 1000 MG capsule Take 1 g by mouth daily.        Marland Kitchen ibuprofen (ADVIL,MOTRIN) 200 MG tablet Take 600 mg by mouth every 6 (six) hours as needed. Pain       . Multiple Vitamin (MULTIVITAMIN) tablet Take 1 tablet by mouth daily.          No results found for this or any previous visit (from the past 48 hour(s)). No results found.  Positive for back and leg pain and a history of colon cancer  Blood pressure 160/89, pulse 62, temperature 97.6 F (36.4 C), temperature source Oral, resp. rate 16, SpO2 98.00%.  The patient is awake or and oriented. He has no facial asymmetry. His gait is normal. Strength is 5 over 5 sensation is intact. He has a decreased reflex is diffusely Assessment/Plan Impression is that of a herniated disc recurrent at L5-S1 on  the right. The plan is for a right L5-S1 microdiscectomy redo.  Faythe Ghee, MD 10/03/2013, 1:33 PM

## 2013-10-03 NOTE — Plan of Care (Signed)
Problem: Consults Goal: Diagnosis - Spinal Surgery Outcome: Completed/Met Date Met:  10/03/13 Microdiscectomy

## 2013-10-03 NOTE — Transfer of Care (Signed)
Immediate Anesthesia Transfer of Care Note  Patient: Derek Crosby  Procedure(s) Performed: Procedure(s): Right LUMBAR FIVE SACRAL ONE  Diskectomy      (Right)  Patient Location: PACU  Anesthesia Type:General  Level of Consciousness: awake, alert  and oriented  Airway & Oxygen Therapy: Patient Spontanous Breathing  Post-op Assessment: Post -op Vital signs reviewed and stable  Post vital signs: stable  Complications: No apparent anesthesia complications

## 2013-10-03 NOTE — Anesthesia Preprocedure Evaluation (Addendum)
Anesthesia Evaluation  Patient identified by MRN, date of birth, ID band Patient awake    Reviewed: Allergy & Precautions, H&P , NPO status , Patient's Chart, lab work & pertinent test results, reviewed documented beta blocker date and time   History of Anesthesia Complications (+) PONV and history of anesthetic complications  Airway Mallampati: II TM Distance: >3 FB Neck ROM: full    Dental  (+) Teeth Intact and Dental Advisory Given   Pulmonary neg pulmonary ROS,  breath sounds clear to auscultation        Cardiovascular negative cardio ROS  Rhythm:regular     Neuro/Psych negative neurological ROS  negative psych ROS   GI/Hepatic Neg liver ROS, Colon cancer 2012 with resection and chemo.   Endo/Other  negative endocrine ROS  Renal/GU negative Renal ROS  negative genitourinary   Musculoskeletal   Abdominal   Peds  Hematology negative hematology ROS (+)   Anesthesia Other Findings See surgeon's H&P   Reproductive/Obstetrics negative OB ROS                          Anesthesia Physical Anesthesia Plan  ASA: I  Anesthesia Plan: General   Post-op Pain Management:    Induction: Intravenous  Airway Management Planned: Oral ETT  Additional Equipment:   Intra-op Plan:   Post-operative Plan: Extubation in OR  Informed Consent: I have reviewed the patients History and Physical, chart, labs and discussed the procedure including the risks, benefits and alternatives for the proposed anesthesia with the patient or authorized representative who has indicated his/her understanding and acceptance.   Dental Advisory Given  Plan Discussed with: CRNA and Surgeon  Anesthesia Plan Comments:         Anesthesia Quick Evaluation

## 2013-10-03 NOTE — Anesthesia Postprocedure Evaluation (Signed)
Anesthesia Post Note  Patient: Derek Crosby  Procedure(s) Performed: Procedure(s) (LRB): Right LUMBAR FIVE SACRAL ONE  Diskectomy      (Right)  Anesthesia type: General  Patient location: PACU  Post pain: Pain level controlled  Post assessment: Patient's Cardiovascular Status Stable  Last Vitals:  Filed Vitals:   10/03/13 1645  BP:   Pulse: 49  Temp:   Resp: 6    Post vital signs: Reviewed and stable  Level of consciousness: alert  Complications: No apparent anesthesia complications

## 2013-10-03 NOTE — Op Note (Signed)
Preop diagnosis: Herniated disc L5-S1 right, recurrent Postop diagnosis: Same Procedure: Right L5-S1 redo microdiscectomy Surgeon: Aleda Madl Assistant: Luiz Ochoa  After being placed the prone position the patient's back was prepped and draped in usual sterile fashion. Localizing x-rays taken prior to incision to identify the appropriate level. Midline incision was made above the spinous processes of L5 and S1. Using Bovie cutting current the incision was carried on the spinous processes. Subcostal dissection was then carried out on the right side spinous processes and lamina and subcutaneous tract was posterior. X-ray showed approach the appropriate level. Is a high-speed drill the previous laminotomy was identified and enlarged. We then brought the microscope in the field and used for the remainder of the case. Is microdissection technique we identified the pedicle of S1 and dissected superiorly to the disc. Rate to get some a mobilization of the S1 nerve root and then identified the disc. This was then incised and thoroughly cleaned out with pituitary rongeurs and curettes. 2 large fragments of herniated disc dural identified lateral to the thecal sac and these were removed this time inspection was carried out in all directions any evidence of residual compression and none could be identified. Irrigation was carried out and any bleeding control proper coagulation and Gelfoam. The was then closed in multiple layers of Vicryl on the muscle fascia subcutaneous and subcuticular tissues. Dermabond Steri-Strips were placed on the skin. Shortness was the was then applied and the patient was extubated and taken to recovery room in stable condition.

## 2013-10-03 NOTE — Anesthesia Procedure Notes (Addendum)
Procedure Name: Intubation Date/Time: 10/03/2013 1:52 PM Performed by: Jenne Campus Pre-anesthesia Checklist: Patient identified, Emergency Drugs available, Suction available, Patient being monitored and Timeout performed Patient Re-evaluated:Patient Re-evaluated prior to inductionOxygen Delivery Method: Circle system utilized Preoxygenation: Pre-oxygenation with 100% oxygen Intubation Type: IV induction Ventilation: Mask ventilation without difficulty Laryngoscope Size: Miller and 2 Grade View: Grade I Tube type: Oral Tube size: 7.5 mm Number of attempts: 1 Airway Equipment and Method: Stylet Placement Confirmation: ETT inserted through vocal cords under direct vision,  positive ETCO2,  CO2 detector and breath sounds checked- equal and bilateral Secured at: 23 cm Tube secured with: Tape Dental Injury: Teeth and Oropharynx as per pre-operative assessment

## 2013-10-04 NOTE — Progress Notes (Signed)
Pt doing very well. Pt and wife given D/C instructions with verbal understanding of teaching. Pt D/C'd home via wheelchair @ 1010 per MD order. Pt stable at D/C and has no other needs @ this time. Holli Humbles, RN

## 2013-10-04 NOTE — Discharge Summary (Signed)
  Physician Discharge Summary  Patient ID: Derek Crosby MRN: [id number] DOB/AGE: Jul 16, 1961 53 y.o.  Admit date: 10/03/2013 Discharge date: 10/04/2013  Admission Diagnoses:  Discharge Diagnoses:  Active Problems:   Lumbar disc herniation   Discharged Condition: good  Hospital Course: Surgery one day for recurrent herniated disc. Did well. No post op issues. No pain post op. Wound fine. Home pod 1, specific instructions given.  Consults: None  Significant Diagnostic Studies: none  Treatments: surgery: L5S1 redo discectomy  Discharge Exam: Blood pressure 120/62, pulse 72, temperature 98.4 F (36.9 C), temperature source Oral, resp. rate 16, SpO2 95.00%. Incision/Wound:clean and dry; no new neuro issues  Disposition: 01-Home or Self Care   Future Appointments Provider Department Dept Phone   02/19/2014 8:00 AM [facility] [phone]   02/19/2014 8:30 AM [facility] [phone]   Liquids only 4 hours prior to your exam. Any medications can be taken as usual. Please arrive 15 min prior to your scheduled exam time.   02/26/2014 8:30 AM Ladell Pier, MD [facility] [phone]       Medication List    ASK your doctor about these medications       acetaminophen 500 MG tablet  Commonly known as:  TYLENOL  Take 1,000 mg by mouth every 6 (six) hours as needed for mild pain.     cholecalciferol 1000 UNITS tablet  Commonly known as:  VITAMIN D  Take 2,000 Units by mouth daily.     fish oil-omega-3 fatty acids 1000 MG capsule  Take 1 g by mouth daily.     ibuprofen 200 MG tablet  Commonly known as:  ADVIL,MOTRIN  - Take 600 mg by mouth every 6 (six) hours as needed. Pain  -      multivitamin tablet  Take 1 tablet by mouth daily.         At home rest most of the time. Get up 9 or 10 times each day and take a 15 or 20 minute walk. No riding in the  car and to your first postoperative appointment. If you have neck surgery you may shower from the chest down starting on the third postoperative day. If you had back surgery he may start showering on the third postoperative day with saran wrap wrapped around your incisional area 3 times. After the shower remove the saran wrap. Take pain medicine as needed and other medications as instructed. Call my office for an appointment.  SignedFaythe Ghee, MD 10/04/2013, 8:41 AM

## 2013-10-06 ENCOUNTER — Encounter (HOSPITAL_COMMUNITY): Payer: Self-pay | Admitting: Neurosurgery

## 2014-02-19 ENCOUNTER — Other Ambulatory Visit (HOSPITAL_BASED_OUTPATIENT_CLINIC_OR_DEPARTMENT_OTHER): Payer: BC Managed Care – PPO

## 2014-02-19 ENCOUNTER — Ambulatory Visit (HOSPITAL_COMMUNITY)
Admission: RE | Admit: 2014-02-19 | Discharge: 2014-02-19 | Disposition: A | Discharge status: Home / Self Care | Payer: BC Managed Care – PPO | Source: Ambulatory Visit | Attending: Nurse Practitioner | Admitting: Nurse Practitioner

## 2014-02-19 DIAGNOSIS — C189 Malignant neoplasm of colon, unspecified: Secondary | ICD-10-CM

## 2014-02-19 DIAGNOSIS — R911 Solitary pulmonary nodule: Secondary | ICD-10-CM | POA: Insufficient documentation

## 2014-02-19 DIAGNOSIS — N400 Enlarged prostate without lower urinary tract symptoms: Secondary | ICD-10-CM | POA: Insufficient documentation

## 2014-02-19 DIAGNOSIS — C187 Malignant neoplasm of sigmoid colon: Secondary | ICD-10-CM

## 2014-02-19 DIAGNOSIS — N281 Cyst of kidney, acquired: Secondary | ICD-10-CM | POA: Insufficient documentation

## 2014-02-19 DIAGNOSIS — K7689 Other specified diseases of liver: Secondary | ICD-10-CM | POA: Insufficient documentation

## 2014-02-19 LAB — CBC WITH DIFFERENTIAL/PLATELET
BASO%: 0.6 % (ref 0.0–2.0)
Basophils Absolute: 0 10*3/uL (ref 0.0–0.1)
EOS%: 1.7 % (ref 0.0–7.0)
Eosinophils Absolute: 0.1 10*3/uL (ref 0.0–0.5)
HCT: 47.6 % (ref 38.4–49.9)
HGB: 16.3 g/dL (ref 13.0–17.1)
LYMPH#: 1.5 10*3/uL (ref 0.9–3.3)
LYMPH%: 32.9 % (ref 14.0–49.0)
MCH: 31 pg (ref 27.2–33.4)
MCHC: 34.3 g/dL (ref 32.0–36.0)
MCV: 90.2 fL (ref 79.3–98.0)
MONO#: 0.4 10*3/uL (ref 0.1–0.9)
MONO%: 8.7 % (ref 0.0–14.0)
NEUT#: 2.6 10*3/uL (ref 1.5–6.5)
NEUT%: 56.1 % (ref 39.0–75.0)
Platelets: 157 10*3/uL (ref 140–400)
RBC: 5.28 10*6/uL (ref 4.20–5.82)
RDW: 12.3 % (ref 11.0–14.6)
WBC: 4.7 10*3/uL (ref 4.0–10.3)

## 2014-02-19 LAB — COMPREHENSIVE METABOLIC PANEL (CC13)
ALT: 21 U/L (ref 0–55)
AST: 28 U/L (ref 5–34)
Albumin: 3.7 g/dL (ref 3.5–5.0)
Alkaline Phosphatase: 63 U/L (ref 40–150)
Anion Gap: 12 mEq/L — ABNORMAL HIGH (ref 3–11)
BUN: 18.6 mg/dL (ref 7.0–26.0)
CHLORIDE: 104 meq/L (ref 98–109)
CO2: 22 mEq/L (ref 22–29)
CREATININE: 1.1 mg/dL (ref 0.7–1.3)
Calcium: 9.3 mg/dL (ref 8.4–10.4)
Glucose: 93 mg/dl (ref 70–140)
Potassium: 4.3 mEq/L (ref 3.5–5.1)
Sodium: 139 mEq/L (ref 136–145)
Total Bilirubin: 0.62 mg/dL (ref 0.20–1.20)
Total Protein: 7.1 g/dL (ref 6.4–8.3)

## 2014-02-19 LAB — CEA: CEA: 0.9 ng/mL (ref 0.0–5.0)

## 2014-02-19 MED ORDER — IOHEXOL 300 MG/ML  SOLN
100.0000 mL | Freq: Once | INTRAMUSCULAR | Status: AC | PRN
Start: 2014-02-19 — End: 2014-02-19
  Administered 2014-02-19: 100 mL via INTRAVENOUS

## 2014-02-21 ENCOUNTER — Other Ambulatory Visit: Payer: Self-pay | Admitting: Oncology

## 2014-02-22 ENCOUNTER — Telehealth: Payer: Self-pay | Admitting: *Deleted

## 2014-02-22 NOTE — Telephone Encounter (Signed)
Message copied by Norma Fredrickson on Thu Feb 22, 2014  9:53 AM ------      Message from: Betsy Coder B      Created: Wed Feb 21, 2014  6:09 PM       Please call patient, CT is negative for recurrent disease ------

## 2014-02-22 NOTE — Telephone Encounter (Signed)
Left message for patient to call back regarding lab results.

## 2014-02-22 NOTE — Telephone Encounter (Signed)
Notified that CT was negative for recurrent disease.

## 2014-02-22 NOTE — Telephone Encounter (Signed)
Left message for patient to call back regarding CT results

## 2014-02-26 ENCOUNTER — Ambulatory Visit (HOSPITAL_BASED_OUTPATIENT_CLINIC_OR_DEPARTMENT_OTHER): Payer: BC Managed Care – PPO | Admitting: Oncology

## 2014-02-26 VITALS — BP 141/95 | HR 51 | Temp 97.8°F | Resp 18 | Ht 70.0 in | Wt 190.0 lb

## 2014-02-26 DIAGNOSIS — C187 Malignant neoplasm of sigmoid colon: Secondary | ICD-10-CM

## 2014-02-26 DIAGNOSIS — I10 Essential (primary) hypertension: Secondary | ICD-10-CM

## 2014-02-26 DIAGNOSIS — R059 Cough, unspecified: Secondary | ICD-10-CM

## 2014-02-26 DIAGNOSIS — C189 Malignant neoplasm of colon, unspecified: Secondary | ICD-10-CM

## 2014-02-26 DIAGNOSIS — R05 Cough: Secondary | ICD-10-CM

## 2014-02-26 NOTE — Progress Notes (Signed)
  [facility] OFFICE PROGRESS NOTE   Diagnosis: Colon cancer  INTERVAL HISTORY:   Derek Crosby returns as scheduled. He feels well. Good appetite. No difficulty with bowel function. He underwent back surgery in January and is returning to exercising. He relates a mild cough and sinus drainage to allergies. He plans to see Dr. Marisue Humble in the near future. No neuropathy symptoms. He develops numbness in the fingers only when riding his bike.  Objective:  Vital signs in last 24 hours:  Blood pressure 141/95, pulse 51, temperature 97.8 F (36.6 C), temperature source Oral, resp. rate 18, height 5' 10" (1.778 m), weight 190 lb (86.183 kg), SpO2 100.00%.    HEENT: Neck without mass Lymphatics: No cervical, supra-clavicular, axillary, or inguinal nodes Resp: Lungs clear bilaterally Cardio: Regular rate and rhythm GI: No hepatomegaly, nontender, no mass Vascular: No leg edema      Lab Results:  Lab Results  Component Value Date   WBC 4.7 02/19/2014   HGB 16.3 02/19/2014   HCT 47.6 02/19/2014   MCV 90.2 02/19/2014   PLT 157 02/19/2014   NEUTROABS 2.6 02/19/2014     Lab Results  Component Value Date   CEA 0.9 02/19/2014    Imaging: CTs of the chest, abdomen, and pelvis on 02/19/2014, compared to 02/21/2013: No evidence of metastatic disease. Stable bilateral lung nodules. Multiple hepatic cysts. Medications: I have reviewed the patient's current medications.  Assessment/Plan: 1. Stage III (pT2 pN1a) adenocarcinoma of the sigmoid colon, status post a sigmoid colectomy with primary anastomosis 03/25/2011. Microsatellite instability testing by immunohistochemistry confirmed a microsatellite stable tumor. He began adjuvant FOLFOX chemotherapy 05/21/2011. He completed cycle #7 on 08/14/2011. Oxaliplatin was held with cycle #6 due to thrombocytopenia. The oxaliplatin was dose reduced by 20% with cycle #7. He completed cycle #12 on 11/05/2011. Restaging CT on 02/17/2012 without evidence  of recurrent disease. Restaging CTs 02/21/2013 without evidence of recurrent disease. Restaging CT 02/19/2014 without evidence of recurrent disease. 2. Delayed nausea, improved with the addition of Aloxi. 3. Status post Port-A-Cath placement 05/20/2011. 4. 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 5. Kidney stone in 2009. 6. L5-S1 disk surgery in 1997. 7. Low heart rate, likely due to his young age and physical conditioning. 8. History of thrombocytopenia secondary to chemotherapy-resolved. 9. History of abnormal sensation with swallowing, likely related to oxaliplatin. 10. 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 The numbness and tingling in the fingers and toes has resolved. 11. Mucositis following cycle 10 FOLFOX-resolved. 12. Allergic reaction to oxaliplatin on 10/22/2011-hives that resolved with Benadryl. 13. Right lung nodule-stable on the CT 02/21/2013 and 16 2015 14. Colonoscopy 03/03/2012. Status post removal of polyps (tubular adenoma) from the ascending colon.  15. Colonoscopy 06/08/2013. Internal hemorrhoids. Diverticulosis in the sigmoid colon and in the ascending colon. Examination otherwise normal. 16. Back and right leg pain with MRI 08/13/2013 showing disc protrusion at L5-S1 causing moderate to prominent impingement on the right S1 nerve roots; also mild impingement at L3-4 and L4-5 due to spondylosis and degenerative disc disease.  Status post right L5-S1 microdiscectomy 10/03/2013 17. Hypertension-he will followup with Dr. Marisue Humble   Disposition:  Mr. Wild remains in clinical remission from colon cancer. He will return for an office visit and CEA in 6 months. He plans to followup with Dr. Marisue Humble for management of hypertension and evaluation of the cough.  Betsy Coder, MD  02/26/2014  8:54 AM

## 2014-02-26 NOTE — Patient Instructions (Signed)
**  At next visit try to bring copy of Advanced Directive to have scanned into your medial record**  [place] INSTRUCTIONS/FOLLOW-UP:  *Follow up with your PCP about your blood pressure.**  Thank you for choosing [facility] to provide your oncology and hematology care.  To afford each patient quality time with our providers, please arrive at least 30 minutes before your scheduled appointment time.  With your help, our goal is to use those 30 minutes to complete the necessary work-up to ensure our physicians have the information they need to help with your evaluation and healthcare recommendations.     ___________________  Should you have questions after your visit to [facility], please contact our office at [phone] between the hours of 8:30 a.m. and 4:30 p.m.  Voicemails left after 4:00 p.m. will not be returned until the following business day.  For prescription refill requests, have your pharmacy contact our office with your prescription refill request. We request 24 hour notice for all refill requests.

## 2014-02-28 ENCOUNTER — Telehealth: Payer: Self-pay | Admitting: Oncology

## 2014-02-28 NOTE — Telephone Encounter (Signed)
lvm for pt regarding to Dec appt....sed mailed pt appt sched/avs and letter

## 2014-07-11 ENCOUNTER — Other Ambulatory Visit: Payer: Self-pay | Admitting: Nurse Practitioner

## 2014-08-13 ENCOUNTER — Telehealth: Payer: Self-pay | Admitting: Oncology

## 2014-08-13 NOTE — Telephone Encounter (Signed)
Lvm advising appt chg from 12/14 to 12/18. Also mailed revised appt calendar.

## 2014-08-27 ENCOUNTER — Other Ambulatory Visit: Payer: BC Managed Care – PPO

## 2014-08-27 ENCOUNTER — Ambulatory Visit: Payer: BC Managed Care – PPO | Admitting: Oncology

## 2014-08-31 ENCOUNTER — Ambulatory Visit (HOSPITAL_BASED_OUTPATIENT_CLINIC_OR_DEPARTMENT_OTHER): Payer: BC Managed Care – PPO | Admitting: Oncology

## 2014-08-31 ENCOUNTER — Telehealth: Payer: Self-pay | Admitting: Oncology

## 2014-08-31 ENCOUNTER — Other Ambulatory Visit: Payer: BC Managed Care – PPO

## 2014-08-31 VITALS — BP 132/80 | HR 64 | Temp 98.2°F | Resp 19 | Ht 70.0 in | Wt 192.9 lb

## 2014-08-31 DIAGNOSIS — Z85038 Personal history of other malignant neoplasm of large intestine: Secondary | ICD-10-CM

## 2014-08-31 DIAGNOSIS — I10 Essential (primary) hypertension: Secondary | ICD-10-CM

## 2014-08-31 DIAGNOSIS — C189 Malignant neoplasm of colon, unspecified: Secondary | ICD-10-CM

## 2014-08-31 LAB — CEA: CEA: 0.6 ng/mL (ref 0.0–5.0)

## 2014-08-31 NOTE — Telephone Encounter (Signed)
Confirm appt d/t for June. Mailed avs & cal.

## 2014-08-31 NOTE — Progress Notes (Signed)
  [place] OFFICE PROGRESS NOTE   Diagnosis: Colon cancer  INTERVAL HISTORY:   [name] returns as scheduled. He currently has a "cold "he otherwise feels well. He has been diagnosed with hypertension and is now taking lisinopril. Minimal remaining neuropathy symptoms in the fingers. This does not interfere with activity. He had repeat back surgery this year.  Objective:  Vital signs in last 24 hours:  Blood pressure 132/80, pulse 64, temperature 98.2 F (36.8 C), temperature source Oral, resp. rate 19, height _0  (1.778 m), weight 192 lb 14.4 oz (87.499 kg), SpO2 98 %.    HEENT: Neck without mass Lymphatics: No cervical, supra-clavicular, axillary, or inguinal nodes Resp: Lungs clear bilaterally Cardio: Regular rate and rhythm GI: No hepatomegaly, nontender, no mass Vascular: No leg edema   Lab Results:  CEA pending Medications: I have reviewed the patient's current medications.  Assessment/Plan: 1. Stage III (pT2 pN1a) adenocarcinoma of the sigmoid colon, status post a sigmoid colectomy with primary anastomosis 03/25/2011. Microsatellite instability testing by immunohistochemistry confirmed a microsatellite stable tumor. He began adjuvant FOLFOX chemotherapy 05/21/2011. He completed cycle #7 on 08/14/2011. Oxaliplatin was held with cycle #6 due to thrombocytopenia. The oxaliplatin was dose reduced by 20% with cycle #7. He completed cycle #12 on 11/05/2011. Restaging CT on 02/17/2012 without evidence of recurrent disease. Restaging CTs 02/21/2013 without evidence of recurrent disease. Restaging CT 02/19/2014 without evidence of recurrent disease. 2. Delayed nausea, improved with the addition of Aloxi. 3. Status post Port-A-Cath placement 05/20/2011. 4. 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 5. Kidney stone in 2009. 6. L5-S1 disk surgery in 1997. 7. Low heart rate, likely due to his young age and physical  conditioning. 8. History of thrombocytopenia secondary to chemotherapy-resolved. 9. History of abnormal sensation with swallowing, likely related to oxaliplatin. 10. 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 The numbness and tingling in the fingers and toes has almost completely resolved. 11. Mucositis following cycle 10 FOLFOX-resolved. 12. Allergic reaction to oxaliplatin on 10/22/2011-hives that resolved with Benadryl. 13. Right lung nodule-stable on the CT 02/21/2013 and 02/19/2014 14. Colonoscopy 03/03/2012. Status post removal of polyps (tubular adenoma) from the ascending colon.  15. Colonoscopy 06/08/2013. Internal hemorrhoids. Diverticulosis in the sigmoid colon and in the ascending colon. Examination otherwise normal. 16. Back and right leg pain with MRI 08/13/2013 showing disc protrusion at L5-S1 causing moderate to prominent impingement on the right S1 nerve roots; also mild impingement at L3-4 and L4-5 due to spondylosis and degenerative disc disease.  Status post right L5-S1 microdiscectomy 10/03/2013 17. Hypertension  Disposition:  Derek Crosby remains in clinical remission from colon cancer. He will return for an office visit and CEA in 6 months.  Betsy Coder, MD  08/31/2014  9:05 AM

## 2014-08-31 NOTE — Addendum Note (Signed)
Addended by: Rosalio Macadamia C on: 08/31/2014 05:30 PM   Modules accepted: Medications

## 2014-09-03 ENCOUNTER — Telehealth: Payer: Self-pay | Admitting: *Deleted

## 2014-09-03 NOTE — Telephone Encounter (Signed)
Notified via home VM that CEA was normal.

## 2014-09-03 NOTE — Telephone Encounter (Signed)
-----   Message from Ladell Pier, MD sent at 08/31/2014  4:51 PM EST ----- Please call patient, cea is normal

## 2015-03-08 ENCOUNTER — Other Ambulatory Visit (HOSPITAL_BASED_OUTPATIENT_CLINIC_OR_DEPARTMENT_OTHER): Payer: 59

## 2015-03-08 ENCOUNTER — Telehealth: Payer: Self-pay | Admitting: Oncology

## 2015-03-08 ENCOUNTER — Ambulatory Visit (HOSPITAL_BASED_OUTPATIENT_CLINIC_OR_DEPARTMENT_OTHER): Payer: 59 | Admitting: Oncology

## 2015-03-08 VITALS — BP 116/67 | HR 54 | Temp 98.2°F | Resp 18 | Ht 70.0 in | Wt 188.8 lb

## 2015-03-08 DIAGNOSIS — I10 Essential (primary) hypertension: Secondary | ICD-10-CM | POA: Diagnosis not present

## 2015-03-08 DIAGNOSIS — Z85038 Personal history of other malignant neoplasm of large intestine: Secondary | ICD-10-CM

## 2015-03-08 DIAGNOSIS — C189 Malignant neoplasm of colon, unspecified: Secondary | ICD-10-CM

## 2015-03-08 LAB — CEA: CEA: 0.9 ng/mL (ref 0.0–5.0)

## 2015-03-08 NOTE — Telephone Encounter (Signed)
sw. pt and advised on DEC appt

## 2015-03-08 NOTE — Progress Notes (Signed)
  [place] OFFICE PROGRESS NOTE   Diagnosis: Colon cancer  INTERVAL HISTORY:   Derek Crosby returns as scheduled. He feels well. No complaint.  Objective:  Vital signs in last 24 hours:  Blood pressure 116/67, pulse 54, temperature 98.2 F (36.8 C), temperature source Oral, resp. rate 18, height $RemoveBe'5\' 10"'AcvqlPZjk$  (1.778 m), weight 188 lb 12.8 oz (85.639 kg), SpO2 100 %.    HEENT: Neck without mass Lymphatics: No cervical, supraclavicular, axillary, or inguinal nodes Resp: Lungs clear bilaterally Cardio: Regular rate and rhythm GI: No hepatomegaly, no mass, nontender, slight fullness in the left suprapubic area compared to the right side without a discrete mass Vascular: No leg edema   Lab Results:  Lab Results  Component Value Date   WBC 4.7 02/19/2014   HGB 16.3 02/19/2014   HCT 47.6 02/19/2014   MCV 90.2 02/19/2014   PLT 157 02/19/2014   NEUTROABS 2.6 02/19/2014      Lab Results  Component Value Date   CEA 0.6 08/31/2014   Medications: I have reviewed the patient's current medications.  Assessment/Plan: 1. Stage III (pT2 pN1a) adenocarcinoma of the sigmoid colon, status post a sigmoid colectomy with primary anastomosis 03/25/2011. Microsatellite instability testing by immunohistochemistry confirmed a microsatellite stable tumor. He began adjuvant FOLFOX chemotherapy 05/21/2011. He completed cycle #7 on 08/14/2011. Oxaliplatin was held with cycle #6 due to thrombocytopenia. The oxaliplatin was dose reduced by 20% with cycle #7. He completed cycle #12 on 11/05/2011. Restaging CT on 02/17/2012 without evidence of recurrent disease. Restaging CTs 02/21/2013 without evidence of recurrent disease. Restaging CT 02/19/2014 without evidence of recurrent disease. 2. Delayed nausea, improved with the addition of Aloxi. 3. Status post Port-A-Cath placement 05/20/2011. 4. 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 5. Kidney  stone in 2009. 6. L5-S1 disk surgery in 1997. 7. Low heart rate, likely due to his young age and physical conditioning. 8. History of thrombocytopenia secondary to chemotherapy-resolved. 9. History of abnormal sensation with swallowing, likely related to oxaliplatin. 10. 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 The numbness and tingling in the fingers and toes has almost completely resolved. 11. Mucositis following cycle 10 FOLFOX-resolved. 12. Allergic reaction to oxaliplatin on 10/22/2011-hives that resolved with Benadryl. 13. Right lung nodule-stable on the CT 02/21/2013 and 02/19/2014 14. Colonoscopy 03/03/2012. Status post removal of polyps (tubular adenoma) from the ascending colon.  15. Colonoscopy 06/08/2013. Internal hemorrhoids. Diverticulosis in the sigmoid colon and in the ascending colon. Examination otherwise normal. 16. Back and right leg pain with MRI 08/13/2013 showing disc protrusion at L5-S1 causing moderate to prominent impingement on the right S1 nerve roots; also mild impingement at L3-4 and L4-5 due to spondylosis and degenerative disc disease.  Status post right L5-S1 microdiscectomy 10/03/2013 17. Hypertension   Disposition:  Derek Crosby remains in clinical remission from colon cancer. We will follow-up on the CEA from today. He will return for an office visit and CEA in 6 months.  Betsy Coder, MD  03/08/2015  9:05 AM

## 2015-03-11 ENCOUNTER — Telehealth: Payer: Self-pay | Admitting: *Deleted

## 2015-03-11 NOTE — Telephone Encounter (Signed)
Patient contacted and made aware of CEA results; no questions or concerns at this time. Pt appreciated call.

## 2015-03-11 NOTE — Telephone Encounter (Signed)
-----   Message from Ladell Pier, MD sent at 03/08/2015  5:48 PM EDT ----- Please call patient, cea is normal

## 2015-08-13 ENCOUNTER — Telehealth: Payer: Self-pay | Admitting: Oncology

## 2015-08-13 NOTE — Telephone Encounter (Signed)
Returned patient call re r/s 12/9 appointments. Gave patient new appointment for lab/fu 12/12.

## 2015-08-23 ENCOUNTER — Ambulatory Visit: Payer: 59 | Admitting: Nurse Practitioner

## 2015-08-23 ENCOUNTER — Other Ambulatory Visit: Payer: 59

## 2015-08-26 ENCOUNTER — Encounter: Payer: Self-pay | Admitting: Oncology

## 2015-08-26 ENCOUNTER — Ambulatory Visit (HOSPITAL_BASED_OUTPATIENT_CLINIC_OR_DEPARTMENT_OTHER): Payer: 59 | Admitting: Oncology

## 2015-08-26 ENCOUNTER — Other Ambulatory Visit (HOSPITAL_BASED_OUTPATIENT_CLINIC_OR_DEPARTMENT_OTHER): Payer: 59

## 2015-08-26 VITALS — BP 124/74 | HR 72 | Temp 98.1°F | Resp 18 | Ht 70.0 in | Wt 193.9 lb

## 2015-08-26 DIAGNOSIS — Z85038 Personal history of other malignant neoplasm of large intestine: Secondary | ICD-10-CM

## 2015-08-26 DIAGNOSIS — I10 Essential (primary) hypertension: Secondary | ICD-10-CM | POA: Diagnosis not present

## 2015-08-26 DIAGNOSIS — C189 Malignant neoplasm of colon, unspecified: Secondary | ICD-10-CM

## 2015-08-26 NOTE — Progress Notes (Signed)
  [name] OFFICE PROGRESS NOTE   Diagnosis: Colon cancer  INTERVAL HISTORY:   Mr. Brink returns as scheduled. He feels well. No complaint.  Objective:  Vital signs in last 24 hours:  Blood pressure 124/74, pulse 72, temperature 98.1 F (36.7 C), temperature source Oral, resp. rate 18, height _0  (1.778 m), weight 193 lb 14.4 oz (87.952 kg), SpO2 99 %.    HEENT: Neck without mass Lymphatics: No cervical, supraclavicular, axillary, or inguinal nodes Resp: Lungs clear bilaterally Cardio: Regular rate and rhythm GI: No hepatomegaly, no mass, nontender, slight fullness in the left suprapubic area compared to the right side without a discrete mass Vascular: No leg edema   Lab Results:  Lab Results  Component Value Date   WBC 4.7 02/19/2014   HGB 16.3 02/19/2014   HCT 47.6 02/19/2014   MCV 90.2 02/19/2014   PLT 157 02/19/2014   NEUTROABS 2.6 02/19/2014      Lab Results  Component Value Date   CEA 0.9 03/08/2015   Medications: I have reviewed the patient's current medications.  Assessment/Plan: 1. Stage III (pT2 pN1a) adenocarcinoma of the sigmoid colon, status post a sigmoid colectomy with primary anastomosis 03/25/2011. Microsatellite instability testing by immunohistochemistry confirmed a microsatellite stable tumor. He began adjuvant FOLFOX chemotherapy 05/21/2011. He completed cycle #7 on 08/14/2011. Oxaliplatin was held with cycle #6 due to thrombocytopenia. The oxaliplatin was dose reduced by 20% with cycle #7. He completed cycle #12 on 11/05/2011. Restaging CT on 02/17/2012 without evidence of recurrent disease. Restaging CTs 02/21/2013 without evidence of recurrent disease. Restaging CT 02/19/2014 without evidence of recurrent disease. 2. Delayed nausea, improved with the addition of Aloxi. 3. Status post Port-A-Cath placement 05/20/2011. 4. 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 5. Kidney  stone in 2009. 6. L5-S1 disk surgery in 1997. 7. Low heart rate, likely due to his young age and physical conditioning. 8. History of thrombocytopenia secondary to chemotherapy-resolved. 9. History of abnormal sensation with swallowing, likely related to oxaliplatin. 10. 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 The numbness and tingling in the fingers and toes has almost completely resolved. 11. Mucositis following cycle 10 FOLFOX-resolved. 12. Allergic reaction to oxaliplatin on 10/22/2011-hives that resolved with Benadryl. 13. Right lung nodule-stable on the CT 02/21/2013 and 02/19/2014 14. Colonoscopy 03/03/2012. Status post removal of polyps (tubular adenoma) from the ascending colon.  15. Colonoscopy 06/08/2013. Internal hemorrhoids. Diverticulosis in the sigmoid colon and in the ascending colon. Examination otherwise normal. 16. Back and right leg pain with MRI 08/13/2013 showing disc protrusion at L5-S1 causing moderate to prominent impingement on the right S1 nerve roots; also mild impingement at L3-4 and L4-5 due to spondylosis and degenerative disc disease.  Status post right L5-S1 microdiscectomy 10/03/2013 17. Hypertension   Disposition:  Derek Crosby remains in clinical remission from colon cancer. We will follow-up on the CEA from today. He will return for an office visit and CEA in 6 months.  Mikey Bussing, DNP, AGPCNP-BC, AOCNP  08/26/2015  3:54 PM

## 2015-08-27 ENCOUNTER — Telehealth: Payer: Self-pay | Admitting: Oncology

## 2015-08-27 LAB — CEA

## 2015-08-27 NOTE — Telephone Encounter (Signed)
s.w. pt and advised on June 2017 appt.....pt ok and aware °

## 2016-01-31 ENCOUNTER — Telehealth: Payer: Self-pay | Admitting: Oncology

## 2016-01-31 NOTE — Telephone Encounter (Signed)
pt called to r/s appt...done....pt ok and aware of new d.t °

## 2016-03-03 ENCOUNTER — Other Ambulatory Visit: Payer: 59

## 2016-03-03 ENCOUNTER — Ambulatory Visit: Payer: 59 | Admitting: Oncology

## 2016-03-19 ENCOUNTER — Ambulatory Visit (HOSPITAL_BASED_OUTPATIENT_CLINIC_OR_DEPARTMENT_OTHER): Payer: 59 | Admitting: Oncology

## 2016-03-19 ENCOUNTER — Other Ambulatory Visit (HOSPITAL_BASED_OUTPATIENT_CLINIC_OR_DEPARTMENT_OTHER): Payer: 59

## 2016-03-19 ENCOUNTER — Telehealth: Payer: Self-pay | Admitting: Oncology

## 2016-03-19 VITALS — BP 132/79 | HR 55 | Temp 98.2°F | Resp 20 | Ht 70.0 in | Wt 192.1 lb

## 2016-03-19 DIAGNOSIS — C187 Malignant neoplasm of sigmoid colon: Secondary | ICD-10-CM

## 2016-03-19 DIAGNOSIS — C189 Malignant neoplasm of colon, unspecified: Secondary | ICD-10-CM

## 2016-03-19 DIAGNOSIS — Z85038 Personal history of other malignant neoplasm of large intestine: Secondary | ICD-10-CM

## 2016-03-19 DIAGNOSIS — I10 Essential (primary) hypertension: Secondary | ICD-10-CM | POA: Diagnosis not present

## 2016-03-19 NOTE — Progress Notes (Signed)
  [place] OFFICE PROGRESS NOTE   Diagnosis: Colon cancer  INTERVAL HISTORY:   Derek Crosby returns as scheduled. He feels well. He reports mild neuropathy symptoms on treatment holding a cold objects for a prolonged time. No other neuropathy symptoms.  Objective:  Vital signs in last 24 hours:  Blood pressure 132/79, pulse 55, temperature 98.2 F (36.8 C), temperature source Oral, resp. rate 20, height '5\' 10"'$  (1.778 m), weight 192 lb 1.6 oz (87.136 kg), SpO2 98 %.    HEENT: Neck without mass Lymphatics: No cervical, supraclavicular, axillary, or inguinal nodes Resp: Lungs clear bilaterally Cardio: Regular rate and rhythm GI: No hepatosplenomegaly, no mass, nontender Vascular: No leg edema   Lab Results:  CEA pending  Medications: I have reviewed the patient's current medications.  Assessment/Plan: 1. Stage III (pT2 pN1a) adenocarcinoma of the sigmoid colon, status post a sigmoid colectomy with primary anastomosis 03/25/2011. Microsatellite instability testing by immunohistochemistry confirmed a microsatellite stable tumor. He began adjuvant FOLFOX chemotherapy 05/21/2011. He completed cycle #7 on 08/14/2011. Oxaliplatin was held with cycle #6 due to thrombocytopenia. The oxaliplatin was dose reduced by 20% with cycle #7. He completed cycle #12 on 11/05/2011. Restaging CT on 02/17/2012 without evidence of recurrent disease. Restaging CTs 02/21/2013 without evidence of recurrent disease. Restaging CT 02/19/2014 without evidence of recurrent disease. 2. Delayed nausea, improved with the addition of Aloxi. 3. Status post Port-A-Cath placement 05/20/2011. 4. 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 5. Kidney stone in 2009. 6. L5-S1 disk surgery in 1997. 7. History of a Low heart rate, likely due to his young age and physical conditioning. 8. History of thrombocytopenia secondary to chemotherapy-resolved. 9. History  of abnormal sensation with swallowing, likely related to oxaliplatin. 10. 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 The numbness and tingling in the fingers and toes has resolved. 11. Mucositis following cycle 10 FOLFOX-resolved. 12. Allergic reaction to oxaliplatin on 10/22/2011-hives that resolved with Benadryl. 13. Right lung nodule-stable on the CT 02/21/2013 and 02/19/2014 14. Colonoscopy 03/03/2012. Status post removal of polyps (tubular adenoma) from the ascending colon.  15. Colonoscopy 06/08/2013. Internal hemorrhoids. Diverticulosis in the sigmoid colon and in the ascending colon. Examination otherwise normal. 16. Back and right leg pain with MRI 08/13/2013 showing disc protrusion at L5-S1 causing moderate to prominent impingement on the right S1 nerve roots; also mild impingement at L3-4 and L4-5 due to spondylosis and degenerative disc disease.  Status post right L5-S1 microdiscectomy 10/03/2013 17. Hypertension    Disposition:  Derek Crosby remains in clinical remission from colon cancer. We will follow-up on the CEA from today. He will return for an office visit in one year. He last underwent a colonoscopy in September 2014. He will contact Dr. Paulita Fujita to schedule the next colonoscopy.  Betsy Coder, MD  03/19/2016  9:39 AM

## 2016-03-19 NOTE — Telephone Encounter (Signed)
Gave and printed appt sched and avs for pt for July 2018 °

## 2016-03-20 LAB — CEA (PARALLEL TESTING): CEA: 0.7 ng/mL

## 2016-03-23 ENCOUNTER — Encounter: Payer: Self-pay | Admitting: Oncology

## 2016-12-03 DIAGNOSIS — H2513 Age-related nuclear cataract, bilateral: Secondary | ICD-10-CM | POA: Diagnosis not present

## 2017-01-26 DIAGNOSIS — J019 Acute sinusitis, unspecified: Secondary | ICD-10-CM | POA: Diagnosis not present

## 2017-01-26 DIAGNOSIS — J9801 Acute bronchospasm: Secondary | ICD-10-CM | POA: Diagnosis not present

## 2017-01-26 DIAGNOSIS — R05 Cough: Secondary | ICD-10-CM | POA: Diagnosis not present

## 2017-03-25 ENCOUNTER — Other Ambulatory Visit (HOSPITAL_BASED_OUTPATIENT_CLINIC_OR_DEPARTMENT_OTHER): Payer: 59

## 2017-03-25 ENCOUNTER — Ambulatory Visit (HOSPITAL_BASED_OUTPATIENT_CLINIC_OR_DEPARTMENT_OTHER): Payer: 59 | Admitting: Oncology

## 2017-03-25 VITALS — BP 111/77 | HR 58 | Temp 98.0°F | Resp 20 | Ht 70.0 in | Wt 189.0 lb

## 2017-03-25 DIAGNOSIS — Z85038 Personal history of other malignant neoplasm of large intestine: Secondary | ICD-10-CM

## 2017-03-25 DIAGNOSIS — C187 Malignant neoplasm of sigmoid colon: Secondary | ICD-10-CM

## 2017-03-25 LAB — CEA (IN HOUSE-CHCC): CEA (CHCC-IN HOUSE): 1.32 ng/mL (ref 0.00–5.00)

## 2017-03-25 NOTE — Progress Notes (Signed)
  [place] OFFICE PROGRESS NOTE   Diagnosis: Colon cancer  INTERVAL HISTORY:   Mr. Clagg returns as scheduled. He feels well. He had an upper respiratory infection during the spring after traveling. Good appetite. No difficulty with bowel function. No remaining neuropathy symptoms.  Objective:  Vital signs in last 24 hours:  Blood pressure 111/77, pulse (!) 58, temperature 98 F (36.7 C), temperature source Oral, resp. rate 20, height _0  (1.778 m), weight 189 lb (85.7 kg), SpO2 97 %.    HEENT: Neck without mass Lymphatics: No cervical, supraclavicular, axillary, or inguinal nodes Resp: Lungs clear bilaterally Cardio: Regular rate and rhythm GI: No hepatomegaly, no mass, nontender Vascular: No leg edema   Lab Results:  CEA-pending  Medications: I have reviewed the patient's current medications.  Assessment/Plan: 1. Stage III (pT2 pN1a) adenocarcinoma of the sigmoid colon, status post a sigmoid colectomy with primary anastomosis 03/25/2011. Microsatellite instability testing by immunohistochemistry confirmed a microsatellite stable tumor. He began adjuvant FOLFOX chemotherapy 05/21/2011. He completed cycle #7 on 08/14/2011. Oxaliplatin was held with cycle #6 due to thrombocytopenia. The oxaliplatin was dose reduced by 20% with cycle #7. He completed cycle #12 on 11/05/2011. Restaging CT on 02/17/2012 without evidence of recurrent disease. Restaging CTs 02/21/2013 without evidence of recurrent disease. Restaging CT 02/19/2014 without evidence of recurrent disease. 2. Delayed nausea, improved with the addition of Aloxi. 3. Status post Port-A-Cath placement 05/20/2011. 4. 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 5. Kidney stone in 2009. 6. L5-S1 disk surgery in 1997. 7. History of a Low heart rate, likely due to his young age and physical conditioning. 8. History of thrombocytopenia secondary to  chemotherapy-resolved. 9. History of abnormal sensation with swallowing, likely related to oxaliplatin. 10. 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 The numbness and tingling in the fingers and toes has resolved. 11. Mucositis following cycle 10 FOLFOX-resolved. 12. Allergic reaction to oxaliplatin on 10/22/2011-hives that resolved with Benadryl. 13. Right lung nodule-stable on the CT 02/21/2013 and 02/19/2014 14. Colonoscopy 03/03/2012. Status post removal of polyps (tubular adenoma) from the ascending colon.  15. Colonoscopy 06/08/2013. Internal hemorrhoids. Diverticulosis in the sigmoid colon and in the ascending colon. Examination otherwise normal. 16. Back and right leg pain with MRI 08/13/2013 showing disc protrusion at L5-S1 causing moderate to prominent impingement on the right S1 nerve roots; also mild impingement at L3-4 and L4-5 due to spondylosis and degenerative disc disease.  Status post right L5-S1 microdiscectomy 10/03/2013 17. Hypertension     Disposition:  Mr. Dralle remains in clinical remission from colon cancer. We will follow-up on the CEA from today. He would like to continue follow[facility] at the [facility]. He will return for an office visit and CEA in one year. I will refer him to Dr. Paulita Fujita for a surveillance colonoscopy.  15 minutes were spent with the patient today. The majority of the time was used for counseling and coordination of care.  Donneta Romberg, MD  03/25/2017  10:52 AM

## 2017-03-26 LAB — CEA (PARALLEL TESTING): CEA: 0.7 ng/mL

## 2017-03-29 ENCOUNTER — Telehealth: Payer: Self-pay | Admitting: *Deleted

## 2017-03-29 ENCOUNTER — Telehealth: Payer: Self-pay | Admitting: Oncology

## 2017-03-29 NOTE — Telephone Encounter (Signed)
-----   Message from Ladell Pier, MD sent at 03/29/2017 12:00 AM EDT ----- Please call patient,cea is normal

## 2017-03-29 NOTE — Telephone Encounter (Signed)
Spoke with Derek Crosby at [facility] re colonoscopy with Dr. Paulita Fujita. Per Derek Crosby they will call patient to schedule. Fax coversheet w/referral to HIM to send referral/notes to [facility].   Other appointments already on schedule. Left message for patient re above and also confirmed July 2019 appointments.

## 2017-04-05 ENCOUNTER — Telehealth: Payer: Self-pay | Admitting: Oncology

## 2017-04-05 NOTE — Telephone Encounter (Signed)
Faxed records to dr outlaw

## 2017-05-12 DIAGNOSIS — Z85038 Personal history of other malignant neoplasm of large intestine: Secondary | ICD-10-CM | POA: Diagnosis not present

## 2017-05-12 DIAGNOSIS — Z86010 Personal history of colonic polyps: Secondary | ICD-10-CM | POA: Diagnosis not present

## 2017-06-09 DIAGNOSIS — Z0000 Encounter for general adult medical examination without abnormal findings: Secondary | ICD-10-CM | POA: Diagnosis not present

## 2017-06-09 DIAGNOSIS — I10 Essential (primary) hypertension: Secondary | ICD-10-CM | POA: Diagnosis not present

## 2017-06-09 DIAGNOSIS — R7301 Impaired fasting glucose: Secondary | ICD-10-CM | POA: Diagnosis not present

## 2017-06-09 DIAGNOSIS — E7800 Pure hypercholesterolemia, unspecified: Secondary | ICD-10-CM | POA: Diagnosis not present

## 2017-06-09 DIAGNOSIS — Z125 Encounter for screening for malignant neoplasm of prostate: Secondary | ICD-10-CM | POA: Diagnosis not present

## 2017-10-11 DIAGNOSIS — E7800 Pure hypercholesterolemia, unspecified: Secondary | ICD-10-CM | POA: Diagnosis not present

## 2017-12-09 DIAGNOSIS — H2513 Age-related nuclear cataract, bilateral: Secondary | ICD-10-CM | POA: Diagnosis not present

## 2018-03-25 ENCOUNTER — Inpatient Hospital Stay: Payer: 59 | Attending: Oncology | Admitting: Oncology

## 2018-03-25 ENCOUNTER — Inpatient Hospital Stay: Payer: 59

## 2018-03-25 VITALS — BP 137/93 | HR 61 | Temp 98.4°F | Resp 17 | Ht 70.0 in | Wt 197.2 lb

## 2018-03-25 DIAGNOSIS — Z85038 Personal history of other malignant neoplasm of large intestine: Secondary | ICD-10-CM | POA: Insufficient documentation

## 2018-03-25 DIAGNOSIS — C187 Malignant neoplasm of sigmoid colon: Secondary | ICD-10-CM

## 2018-03-25 LAB — CEA (IN HOUSE-CHCC): CEA (CHCC-In House): 1.69 ng/mL (ref 0.00–5.00)

## 2018-03-25 NOTE — Progress Notes (Signed)
  Derek Crosby   Diagnosis: Colon cancer  INTERVAL HISTORY:   Derek Crosby returns as scheduled.  He feels well.  Good appetite and energy level.  No difficulty with bowel function.  No bleeding.  He has not undergone a surveillance colonoscopy with Dr. Paulita Crosby.  Objective:  Vital signs in last 24 hours:  Blood pressure (!) 137/93, pulse 61, temperature 98.4 F (36.9 C), temperature source Oral, resp. rate 17, height _0  (1.778 m), weight 197 lb 3.2 oz (89.4 kg), SpO2 97 %.    HEENT: Neck without mass Lymphatics: No cervical, supraclavicular, axillary, or inguinal nodes Resp: Lungs clear bilaterally Cardio: Regular rate and rhythm GI: No hepatosplenomegaly, no mass, nontender Vascular: No leg edema     Lab Results  Component Value Date   CEA1 1.32 03/25/2017    Medications: I have reviewed the patient's current medications.   Assessment/Plan: 1. Stage III (pT2 pN1a) adenocarcinoma of the sigmoid colon, status post a sigmoid colectomy with primary anastomosis 03/25/2011. Microsatellite instability testing by immunohistochemistry confirmed a microsatellite stable tumor. He began adjuvant FOLFOX chemotherapy 05/21/2011. He completed cycle #7 on 08/14/2011. Oxaliplatin was held with cycle #6 due to thrombocytopenia. The oxaliplatin was dose reduced by 20% with cycle #7. He completed cycle #12 on 11/05/2011. Restaging CT on 02/17/2012 without evidence of recurrent disease. Restaging CTs 02/21/2013 without evidence of recurrent disease. Restaging CT 02/19/2014 without evidence of recurrent disease. 2. Delayed nausea, improved with the addition of Aloxi. 3. Status post Port-A-Cath placement 05/20/2011. 4. 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 5. Kidney stone in 2009. 6. L5-S1 disk surgery in 1997. 7. History of a Low heart rate, likely due to his young age and physical conditioning. 8. History  of thrombocytopenia secondary to chemotherapy-resolved. 9. History of abnormal sensation with swallowing, likely related to oxaliplatin. 10. 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 The numbness and tingling in the fingers and toes has resolved. 11. Mucositis following cycle 10 FOLFOX-resolved. 12. Allergic reaction to oxaliplatin on 10/22/2011-hives that resolved with Benadryl. 13. Right lung nodule-stable on the CT 02/21/2013 and 02/19/2014 14. Colonoscopy 03/03/2012. Status post removal of polyps (tubular adenoma) from the ascending colon.  15. Colonoscopy 06/08/2013. Internal hemorrhoids. Diverticulosis in the sigmoid colon and in the ascending colon. Examination otherwise normal. 16. Back and right leg pain with MRI 08/13/2013 showing disc protrusion at L5-S1 causing moderate to prominent impingement on the right S1 nerve roots; also mild impingement at L3-4 and L4-5 due to spondylosis and degenerative disc disease.  Status post right L5-S1 microdiscectomy 10/03/2013 17. Hypertension   Disposition: Derek Crosby is in clinical remission from colon cancer.  We will follow-up on the CEA from today.  He would like to continue follow-up at the Cancer center.  He will return for an office visit in 1 year.  I made a referral to Dr. Paulita Crosby for a surveillance colonoscopy.  15 minutes were spent with the patient today.  The majority of the time was used for counseling and coordination of care.  Derek Coder, MD  03/25/2018  11:55 AM

## 2018-03-29 ENCOUNTER — Telehealth: Payer: Self-pay

## 2018-03-29 NOTE — Telephone Encounter (Addendum)
Pt voiced understanding of message below    ----- Message from Derek Pier, MD sent at 03/25/2018  5:00 PM EDT ----- Please call patient, CEA is normal, follow-up as scheduled

## 2018-03-30 ENCOUNTER — Telehealth: Payer: Self-pay

## 2018-03-30 NOTE — Telephone Encounter (Signed)
Call from [facility] with [facility] regarding requested colonoscopy. Per Dr. Paulita Fujita, pt had colonoscopy in Aug 2018, and 4 others prior to that. Per Dr. Benay Spice, pt does not need an additional colonoscopy now. The colonoscopy schedule is to be determined by Dr. Paulita Fujita. Kelly to send copy of latest colonoscopy to our office.

## 2018-06-10 DIAGNOSIS — R7301 Impaired fasting glucose: Secondary | ICD-10-CM | POA: Diagnosis not present

## 2018-06-10 DIAGNOSIS — Z125 Encounter for screening for malignant neoplasm of prostate: Secondary | ICD-10-CM | POA: Diagnosis not present

## 2018-06-10 DIAGNOSIS — E7800 Pure hypercholesterolemia, unspecified: Secondary | ICD-10-CM | POA: Diagnosis not present

## 2018-06-10 DIAGNOSIS — I10 Essential (primary) hypertension: Secondary | ICD-10-CM | POA: Diagnosis not present

## 2018-06-10 DIAGNOSIS — Z0000 Encounter for general adult medical examination without abnormal findings: Secondary | ICD-10-CM | POA: Diagnosis not present

## 2018-08-15 DIAGNOSIS — Z79899 Other long term (current) drug therapy: Secondary | ICD-10-CM | POA: Diagnosis not present

## 2018-08-15 DIAGNOSIS — E7800 Pure hypercholesterolemia, unspecified: Secondary | ICD-10-CM | POA: Diagnosis not present

## 2019-03-27 ENCOUNTER — Inpatient Hospital Stay (HOSPITAL_BASED_OUTPATIENT_CLINIC_OR_DEPARTMENT_OTHER): Payer: 59 | Admitting: Oncology

## 2019-03-27 ENCOUNTER — Inpatient Hospital Stay: Payer: 59 | Attending: Oncology

## 2019-03-27 ENCOUNTER — Other Ambulatory Visit: Payer: Self-pay

## 2019-03-27 VITALS — BP 112/75 | HR 63 | Temp 98.5°F | Resp 17 | Ht 70.0 in | Wt 199.5 lb

## 2019-03-27 DIAGNOSIS — Z9049 Acquired absence of other specified parts of digestive tract: Secondary | ICD-10-CM | POA: Insufficient documentation

## 2019-03-27 DIAGNOSIS — C187 Malignant neoplasm of sigmoid colon: Secondary | ICD-10-CM

## 2019-03-27 DIAGNOSIS — Z85828 Personal history of other malignant neoplasm of skin: Secondary | ICD-10-CM | POA: Diagnosis not present

## 2019-03-27 DIAGNOSIS — I10 Essential (primary) hypertension: Secondary | ICD-10-CM | POA: Insufficient documentation

## 2019-03-27 DIAGNOSIS — Z85038 Personal history of other malignant neoplasm of large intestine: Secondary | ICD-10-CM

## 2019-03-27 DIAGNOSIS — Z9221 Personal history of antineoplastic chemotherapy: Secondary | ICD-10-CM | POA: Insufficient documentation

## 2019-03-27 LAB — CEA (IN HOUSE-CHCC): CEA (CHCC-In House): 1.37 ng/mL (ref 0.00–5.00)

## 2019-03-27 NOTE — Progress Notes (Signed)
[place] OFFICE PROGRESS NOTE   Diagnosis: Colon cancer  INTERVAL HISTORY:   Derek Crosby returns as scheduled.  He feels well.  A basal cell was removed from the anterior chest wall on 01/16/2019.  He reports Dr. Paulita Fujita did not recommend a colonoscopy when he saw him last year.  No neuropathy symptoms.  Objective:  Vital signs in last 24 hours:  Blood pressure 112/75, pulse 63, temperature 98.5 F (36.9 C), temperature source Oral, resp. rate 17, height 5' 10"  (1.778 m), weight 199 lb 8 oz (90.5 kg), SpO2 97 %.   Limited physical examination secondary to distancing with the coded pandemic HEENT: Neck without mass Lymphatics: No cervical, supraclavicular, axillary, or inguinal nodes GI: No hepatosplenomegaly, no mass, nontender Vascular: No leg edema  Skin: Scar at the site of the skin cancer removal in the xiphoid region    Lab Results:  Lab Results  Component Value Date   WBC 4.7 02/19/2014   HGB 16.3 02/19/2014   HCT 47.6 02/19/2014   MCV 90.2 02/19/2014   PLT 157 02/19/2014   NEUTROABS 2.6 02/19/2014    CMP  Lab Results  Component Value Date   NA 139 02/19/2014   K 4.3 02/19/2014   CL 105 02/21/2013   CO2 22 02/19/2014   GLUCOSE 93 02/19/2014   BUN 18.6 02/19/2014   CREATININE 1.1 02/19/2014   CALCIUM 9.3 02/19/2014   PROT 7.1 02/19/2014   ALBUMIN 3.7 02/19/2014   AST 28 02/19/2014   ALT 21 02/19/2014   ALKPHOS 63 02/19/2014   BILITOT 0.62 02/19/2014    Lab Results  Component Value Date   CEA1 1.69 03/25/2018    Medications: I have reviewed the patient's current medications.   Assessment/Plan: 1. Stage III (pT2 pN1a) adenocarcinoma of the sigmoid colon, status post a sigmoid colectomy with primary anastomosis 03/25/2011. Microsatellite instability testing by immunohistochemistry confirmed a microsatellite stable tumor. He began adjuvant FOLFOX chemotherapy 05/21/2011. He completed cycle #7 on 08/14/2011. Oxaliplatin was held with  cycle #6 due to thrombocytopenia. The oxaliplatin was dose reduced by 20% with cycle #7. He completed cycle #12 on 11/05/2011. Restaging CT on 02/17/2012 without evidence of recurrent disease. Restaging CTs 02/21/2013 without evidence of recurrent disease. Restaging CT 02/19/2014 without evidence of recurrent disease. 2. Delayed nausea, improved with the addition of Aloxi. 3. Status post Port-A-Cath placement 05/20/2011. 4. 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 5. Kidney stone in 2009. 6. L5-S1 disk surgery in 1997. 7. History of a Low heart rate, likely due to his young age and physical conditioning. 8. History of thrombocytopenia secondary to chemotherapy-resolved. 9. History of abnormal sensation with swallowing, likely related to oxaliplatin. 10. 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 The numbness and tingling in the fingers and toes has resolved. 11. Mucositis following cycle 10 FOLFOX-resolved. 12. Allergic reaction to oxaliplatin on 10/22/2011-hives that resolved with Benadryl. 13. Right lung nodule-stable on the CT 02/21/2013 and 02/19/2014 14. Colonoscopy 03/03/2012. Status post removal of polyps (tubular adenoma) from the ascending colon.  15. Colonoscopy 06/08/2013. Internal hemorrhoids. Diverticulosis in the sigmoid colon and in the ascending colon. Examination otherwise normal. 16. Back and right leg pain with MRI 08/13/2013 showing disc protrusion at L5-S1 causing moderate to prominent impingement on the right S1 nerve roots; also mild impingement at L3-4 and L4-5 due to spondylosis and degenerative disc disease.  Status post right L5-S1 microdiscectomy 10/03/2013 17. Hypertension 18. Basal cell  carcinoma anterior chest 01/16/2019   Disposition: Derek Crosby is in clinical remission from colon cancer.  He is now 8 years out from diagnosis.  He has a good  prognosis for a long-term disease-free survival.   He plans to continue clinical follow-up with Dr. Marisue Humble.  He will continue follow-up with Dr. Paulita Fujita for surveillance colonoscopies.  He is scheduled to follow-up with dermatology after the diagnosis of a basal cell carcinoma.  He is not scheduled for an appointment in medical oncology.  I am available to see him in the future as needed.  Betsy Coder, MD  03/27/2019  12:25 PM

## 2019-07-28 ENCOUNTER — Other Ambulatory Visit: Payer: Self-pay

## 2019-07-28 ENCOUNTER — Ambulatory Visit (INDEPENDENT_AMBULATORY_CARE_PROVIDER_SITE_OTHER): Payer: 59 | Admitting: Orthopaedic Surgery

## 2019-07-28 ENCOUNTER — Encounter: Payer: Self-pay | Admitting: Orthopaedic Surgery

## 2019-07-28 ENCOUNTER — Ambulatory Visit: Payer: Self-pay

## 2019-07-28 VITALS — Ht 70.0 in | Wt 195.0 lb

## 2019-07-28 DIAGNOSIS — G8929 Other chronic pain: Secondary | ICD-10-CM | POA: Diagnosis not present

## 2019-07-28 DIAGNOSIS — M25562 Pain in left knee: Secondary | ICD-10-CM

## 2019-07-28 NOTE — Progress Notes (Signed)
Office Visit Note   Patient: Derek Crosby           Date of Birth: 11-03-1960           MRN: [id number] Visit Date: 07/28/2019              Requested by: Gaynelle Arabian, MD [address] [place],  [place] [address] PCP: Gaynelle Arabian, MD   Assessment & Plan: Visit Diagnoses:  1. Chronic pain of left knee     Plan: Impression is lateral meniscus tear left knee.  We will need to evaluate thoroughly with an MRI.  Patient will avoid strenuous activity for now.  I will call the patient with the MRI results.  Questions encouraged and answered.  Follow-Up Instructions: Return if symptoms worsen or fail to improve.   Orders:  Orders Placed This Encounter  Procedures  . XR KNEE 3 VIEW LEFT  . MR Knee Left w/o contrast   No orders of the defined types were placed in this encounter.     Procedures: No procedures performed   Clinical Data: No additional findings.   Subjective: Chief Complaint  Patient presents with  . Left Knee - Pain    Derek Crosby is a very pleasant 58 year old gentleman who lives in my neighborhood who comes in for evaluation of of left knee pain since the summer.  He had a specific injury while playing pickle ball in which he twisted his left knee and felt immediate pain.  Since then he has had pain on the lateral joint line anytime he changes directions with his left foot planted or if he is walking on uneven surfaces.  He denies any swelling or mechanical symptoms.  He takes ibuprofen as needed without significant relief.   Review of Systems  Constitutional: Negative.   All other systems reviewed and are negative.    Objective: Vital Signs: Ht 5\' 10"  (1.778 m)   Wt 195 lb (88.5 kg)   BMI 27.98 kg/m   Physical Exam Vitals signs and nursing note reviewed.  Constitutional:      Appearance: He is well-developed.  HENT:     Head: Normocephalic and atraumatic.  Eyes:     Pupils: Pupils are equal, round, and reactive to light.  Neck:      Musculoskeletal: Neck supple.  Pulmonary:     Effort: Pulmonary effort is normal.  Abdominal:     Palpations: Abdomen is soft.  Musculoskeletal: Normal range of motion.  Skin:    General: Skin is warm.  Neurological:     Mental Status: He is alert and oriented to person, place, and time.  Psychiatric:        Behavior: Behavior normal.        Thought Content: Thought content normal.        Judgment: Judgment normal.     Ortho Exam Left knee exam shows a trace joint effusion.  He has exquisite lateral joint line tenderness and pain with McMurray testing.  Negative Thessaly.  Collaterals and cruciates are stable. Specialty Comments:  No specialty comments available.  Imaging: Xr Knee 3 View Left  Result Date: 07/28/2019 No acute or structural abnormalities    PMFS History: Patient Active Problem List   Diagnosis Date Noted  . Chronic pain of left knee 07/28/2019  . 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 Colon cancer ([place]) 07/29/2011   Past Medical History:  Diagnosis Date  . Arthritis   . colon ca dx'd 02/05/11  .  PONV (postoperative nausea and vomiting)     History reviewed. No pertinent family history.  Past Surgical History:  Procedure Laterality Date  . CYSTOSCOPY/RETROGRADE/URETEROSCOPY  2009   jj stent-rt-kidney stone  . LITHOTRIPSY    . LUMBAR LAMINECTOMY/DECOMPRESSION MICRODISCECTOMY Right 10/03/2013   Procedure: Right LUMBAR FIVE SACRAL ONE  Diskectomy     ;  Surgeon: Faythe Ghee, MD;  Location: [facility];  Service: Neurosurgery;  Laterality: Right;  . MASS EXCISION  04/22/2012   Procedure: EXCISION MASS;  Surgeon: Cammie Sickle., MD;  Location: [facility];  Service: Orthopedics;  Laterality: Right;  Right ring cyst excision  . MICRODISCECTOMY LUMBAR  1997  . PARTIAL COLECTOMY  2012   sigmoid-cancer  . PORT-A-CATH REMOVAL  2012   insertion and out   Social History   Occupational History  . Not on file  Tobacco Use  .  Smoking status: Never Smoker  . Smokeless tobacco: Never Used  Substance and Sexual Activity  . Alcohol use: Yes    Comment: occ  . Drug use: No  . Sexual activity: Not on file

## 2019-08-16 ENCOUNTER — Ambulatory Visit
Admission: RE | Admit: 2019-08-16 | Discharge: 2019-08-16 | Disposition: A | Discharge status: Home / Self Care | Payer: 59 | Source: Ambulatory Visit | Attending: Orthopaedic Surgery | Admitting: Orthopaedic Surgery

## 2019-08-16 ENCOUNTER — Other Ambulatory Visit: Payer: Self-pay

## 2019-08-16 DIAGNOSIS — G8929 Other chronic pain: Secondary | ICD-10-CM

## 2019-08-16 DIAGNOSIS — M25562 Pain in left knee: Secondary | ICD-10-CM

## 2019-08-16 NOTE — Progress Notes (Signed)
Reviewed findings with patient over phone and discussed treatment.  Will follow up as needed.

## 2019-11-25 ENCOUNTER — Ambulatory Visit: Payer: 59 | Attending: Internal Medicine

## 2019-11-25 DIAGNOSIS — Z23 Encounter for immunization: Secondary | ICD-10-CM

## 2019-11-25 NOTE — Progress Notes (Signed)
   Covid-19 Vaccination Clinic  Name:  Derek Crosby    MRN: [id number] DOB: March 02, 1961  11/25/2019  [name] was observed post Covid-19 immunization for 15 minutes without incident. He was provided with Vaccine Information Sheet and instruction to access the V-Safe system.   [name] was instructed to call 911 with any severe reactions post vaccine: Marland Kitchen Difficulty breathing  . Swelling of face and throat  . A fast heartbeat  . A bad rash all over body  . Dizziness and weakness   Immunizations Administered    Name Date Dose VIS Date Route   Pfizer COVID-19 Vaccine 11/25/2019 10:39 AM 0.3 mL 08/25/2019 Intramuscular   Manufacturer: [place]   Lot: [id number]   [place]: [id number]

## 2019-12-19 ENCOUNTER — Ambulatory Visit: Payer: 59 | Attending: Internal Medicine

## 2019-12-19 DIAGNOSIS — Z23 Encounter for immunization: Secondary | ICD-10-CM

## 2019-12-19 NOTE — Progress Notes (Signed)
   Covid-19 Vaccination Clinic  Name:  Derek Crosby    MRN: [id number] DOB: 1961-05-29  12/19/2019  [name] was observed post Covid-19 immunization for 15 minutes without incident. He was provided with Vaccine Information Sheet and instruction to access the V-Safe system.   [name] was instructed to call 911 with any severe reactions post vaccine: Marland Kitchen Difficulty breathing  . Swelling of face and throat  . A fast heartbeat  . A bad rash all over body  . Dizziness and weakness   Immunizations Administered    Name Date Dose VIS Date Route   Pfizer COVID-19 Vaccine 12/19/2019 10:45 AM 0.3 mL 08/25/2019 Intramuscular   Manufacturer: [employer]   Lot: [id number]   [place]: [id number]

## 2020-06-19 ENCOUNTER — Ambulatory Visit (INDEPENDENT_AMBULATORY_CARE_PROVIDER_SITE_OTHER): Payer: 59 | Admitting: Orthopaedic Surgery

## 2020-06-19 ENCOUNTER — Encounter: Payer: Self-pay | Admitting: Orthopaedic Surgery

## 2020-06-19 VITALS — Ht 71.0 in | Wt 205.2 lb

## 2020-06-19 DIAGNOSIS — G8929 Other chronic pain: Secondary | ICD-10-CM | POA: Diagnosis not present

## 2020-06-19 DIAGNOSIS — M25562 Pain in left knee: Secondary | ICD-10-CM | POA: Diagnosis not present

## 2020-06-19 MED ORDER — PREDNISONE 10 MG (21) PO TBPK: ORAL_TABLET | ORAL | 0 refills | Status: AC

## 2020-06-19 MED ORDER — MELOXICAM 7.5 MG PO TABS: 7.5000 mg | ORAL_TABLET | Freq: Two times a day (BID) | ORAL | 2 refills | Status: AC | PRN

## 2020-06-19 NOTE — Progress Notes (Signed)
Office Visit Note   Patient: Derek Crosby           Date of Birth: July 17, 1961           MRN: [id number] Visit Date: 06/19/2020              Requested by: Gaynelle Arabian, MD [address] [place],  [place] [address] PCP: Gaynelle Arabian, MD   Assessment & Plan: Visit Diagnoses:  1. Chronic pain of left knee     Plan: Impression is chronic left knee pain.  I reviewed the MRI again which shows bony edema lateral femoral condyle and some focal grade 4 changes of the lateral femoral trochlea.  I suspect that the pain is coming from those findings.  Based on discussion he has agreed to try a course of outpatient physical therapy and steroid Dosepak and meloxicam for the inflammation.  He will also use a knee brace for activity.  Patient instructed to follow-up in 4 to 6 weeks if he does not notice any improvement.  Follow-Up Instructions: Return if symptoms worsen or fail to improve.   Orders:  Orders Placed This Encounter  Procedures  . Ambulatory referral to Physical Therapy   Meds ordered this encounter  Medications  . predniSONE (STERAPRED UNI-PAK 21 TAB) 10 MG (21) TBPK tablet    Sig: Take as directed    Dispense:  21 tablet    Refill:  0  . meloxicam (MOBIC) 7.5 MG tablet    Sig: Take 1 tablet (7.5 mg total) by mouth 2 (two) times daily as needed for pain.    Dispense:  30 tablet    Refill:  2      Procedures: No procedures performed   Clinical Data: No additional findings.   Subjective: Chief Complaint  Patient presents with  . Left Knee - Pain    Derek Crosby returns today for continued left knee pain.  He denies any changes in his pain or any new injuries.  Not taking any medications.  He mainly has pain when he is doing any activities that requires a flex knee or quad activation such as ascending stairs.   Review of Systems  Constitutional: Negative.   All other systems reviewed and are negative.    Objective: Vital Signs: Ht 5\' 11"  (1.803  m)   Wt 205 lb 3.2 oz (93.1 kg)   BMI 28.62 kg/m   Physical Exam Vitals and nursing note reviewed.  Constitutional:      Appearance: He is well-developed.  Pulmonary:     Effort: Pulmonary effort is normal.  Abdominal:     Palpations: Abdomen is soft.  Skin:    General: Skin is warm.  Neurological:     Mental Status: He is alert and oriented to person, place, and time.  Psychiatric:        Behavior: Behavior normal.        Thought Content: Thought content normal.        Judgment: Judgment normal.     Ortho Exam Left knee exam is unchanged.  No joint effusion. Specialty Comments:  No specialty comments available.  Imaging: No results found.   PMFS History: Patient Active Problem List   Diagnosis Date Noted  . Chronic pain of left knee 07/28/2019  . 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 Colon cancer ([place]) 07/29/2011   Past Medical History:  Diagnosis Date  . Arthritis   . colon ca dx'd 02/05/11  . PONV (postoperative nausea and  vomiting)     History reviewed. No pertinent family history.  Past Surgical History:  Procedure Laterality Date  . CYSTOSCOPY/RETROGRADE/URETEROSCOPY  2009   jj stent-rt-kidney stone  . LITHOTRIPSY    . LUMBAR LAMINECTOMY/DECOMPRESSION MICRODISCECTOMY Right 10/03/2013   Procedure: Right LUMBAR FIVE SACRAL ONE  Diskectomy     ;  Surgeon: Faythe Ghee, MD;  Location: [place] [facility];  Service: Neurosurgery;  Laterality: Right;  . MASS EXCISION  04/22/2012   Procedure: EXCISION MASS;  Surgeon: Cammie Sickle., MD;  Location: [facility];  Service: Orthopedics;  Laterality: Right;  Right ring cyst excision  . MICRODISCECTOMY LUMBAR  1997  . PARTIAL COLECTOMY  2012   sigmoid-cancer  . PORT-A-CATH REMOVAL  2012   insertion and out   Social History   Occupational History  . Not on file  Tobacco Use  . Smoking status: Never Smoker  . Smokeless tobacco: Never Used  Substance and Sexual Activity  . Alcohol use: Yes     Comment: occ  . Drug use: No  . Sexual activity: Not on file

## 2020-07-09 ENCOUNTER — Other Ambulatory Visit: Payer: Self-pay

## 2020-07-09 ENCOUNTER — Encounter: Payer: Self-pay | Admitting: Physical Therapy

## 2020-07-09 ENCOUNTER — Ambulatory Visit (INDEPENDENT_AMBULATORY_CARE_PROVIDER_SITE_OTHER): Payer: 59 | Admitting: Physical Therapy

## 2020-07-09 DIAGNOSIS — M25562 Pain in left knee: Secondary | ICD-10-CM | POA: Diagnosis not present

## 2020-07-09 DIAGNOSIS — M25662 Stiffness of left knee, not elsewhere classified: Secondary | ICD-10-CM

## 2020-07-09 DIAGNOSIS — M6281 Muscle weakness (generalized): Secondary | ICD-10-CM | POA: Diagnosis not present

## 2020-07-09 DIAGNOSIS — G8929 Other chronic pain: Secondary | ICD-10-CM | POA: Diagnosis not present

## 2020-07-09 NOTE — Patient Instructions (Signed)
Access Code: [email or web address] URL: [email or web address] Date: 07/09/2020 Prepared by: Elsie Ra  Exercises Supine Quadriceps Stretch with Strap on Table - 2 x daily - 6 x weekly - 2-3 reps - 30 hold Supine ITB Stretch with Strap - 2 x daily - 6 x weekly - 2-3 reps - 30 hold Sidelying Hip Abduction - 2 x daily - 6 x weekly - 2 sets - 10-20 reps Sidelying Hip Adduction - 2 x daily - 6 x weekly - 2 sets - 10-20 reps Sit to Stand without Arm Support - 2 x daily - 6 x weekly - 2-3 sets - 10 reps Hip Hiking on Step - 2 x daily - 6 x weekly - 3 sets - 10 reps Mini Lunge - 2 x daily - 6 x weekly - 2-3 sets - 10 reps

## 2020-07-09 NOTE — Therapy (Signed)
[facility] Physical Therapy [address] [place], [place], [address] Phone: [phone]   Fax:  [phone]  Physical Therapy Evaluation  Patient Details  Name: Derek Crosby MRN: [id number] Date of Birth: 10-07-1960 Referring Provider (PT): Frankey Shown MD   Encounter Date: 07/09/2020   PT End of Session - 07/09/20 1648    Visit Number 1    Number of Visits 12    Date for PT Re-Evaluation 08/20/20    PT Start Time 9357    PT Stop Time 1600    PT Time Calculation (min) 45 min    Activity Tolerance Patient tolerated treatment well    Behavior During Therapy [facility] for tasks assessed/performed           Past Medical History:  Diagnosis Date  . Arthritis   . colon ca dx'd 02/05/11  . PONV (postoperative nausea and vomiting)     Past Surgical History:  Procedure Laterality Date  . CYSTOSCOPY/RETROGRADE/URETEROSCOPY  2009   jj stent-rt-kidney stone  . LITHOTRIPSY    . LUMBAR LAMINECTOMY/DECOMPRESSION MICRODISCECTOMY Right 10/03/2013   Procedure: Right LUMBAR FIVE SACRAL ONE  Diskectomy     ;  Surgeon: Faythe Ghee, MD;  Location: [place] [facility];  Service: Neurosurgery;  Laterality: Right;  . MASS EXCISION  04/22/2012   Procedure: EXCISION MASS;  Surgeon: Cammie Sickle., MD;  Location: [facility];  Service: Orthopedics;  Laterality: Right;  Right ring cyst excision  . MICRODISCECTOMY LUMBAR  1997  . PARTIAL COLECTOMY  2012   sigmoid-cancer  . PORT-A-CATH REMOVAL  2012   insertion and out    There were no vitals filed for this visit.    Subjective Assessment - 07/09/20 1523    Subjective He relays some injury to his Lt knee last year playing pickle ball. He says MD has theory that he may have dislocated it and wore away the cartilage under knee cap. He says he is normally very active but has has gained 40 lbs since covid. He feels his knee might give out on him at times.    Pertinent History PMH: lumbar decompression  2015     Limitations Standing;Walking;Lifting;Other (comment)    Diagnostic tests Lt knee MRI 08/2019: "Focal areas of full-thickness cartilage loss along the lateral aspect of the trochlear groove and the anterior aspect of thelateral femoral condyle with subcortical edema. Trace joint effusion with small complex Baker's cyst."    Patient Stated Goals reduce pain, strengthen knee    Currently in Pain? Yes    Pain Score --   3-9   Pain Orientation Left    Pain Descriptors / Indicators Aching;Stabbing    Pain Type Chronic pain    Pain Radiating Towards denies radiculopathy    Pain Onset More than a month ago    Pain Frequency Intermittent    Aggravating Factors  stairs, biking, squatting, running, sitting    Pain Relieving Factors rest, knee brace    Multiple Pain Sites No              [facility] PT Assessment - 07/09/20 0001      Assessment   Medical Diagnosis Chronic pain of left knee, Patellofemoral chondromalacia     Referring Provider (PT) Frankey Shown MD    Onset Date/Surgical Date --   one year onset of pain   Next MD Visit PRN    Prior Therapy none      Precautions   Precautions None  Restrictions   Weight Bearing Restrictions No      Balance Screen   Has the patient fallen in the past 6 months No    Has the patient had a decrease in activity level because of a fear of falling?  No    Is the patient reluctant to leave their home because of a fear of falling?  No      Prior Function   Level of Independence Independent    Vocation Full time employment    Community education officer, sitting work    Leisure bike, Conservation officer, historic buildings   Overall Cognitive Status Within Functional Limits for tasks assessed      Observation/Other Assessments   Focus on Therapeutic Outcomes (FOTO)  FUNCTIONAL intake 57%, predicted 70%      Functional Tests   Functional tests Step down;Single leg stance;Squat      Squat   Comments decreased weight shift to left      Step Down    Comments increased knee valgus on Lt compared to Rt      Single Leg Stance   Comments good SLS bilat able to hold at least 20 sec      ROM / Strength   AROM / PROM / Strength AROM;PROM;Strength      AROM   AROM Assessment Site Knee    Right/Left Knee Left    Left Knee Extension 0    Left Knee Flexion 140      Strength   Overall Strength Comments SL reach test 18.75 inches on Rt leg and 18.25 inches on Lt leg    Strength Assessment Site Knee;Hip    Right/Left Hip Right;Left    Right Hip Flexion 5/5    Right Hip ABduction 4+/5    Right Hip ADduction 4+/5    Left Hip Flexion 5/5    Left Hip External Rotation 4+/5    Left Hip Internal Rotation 4+/5    Left Hip ABduction 4/5    Left Hip ADduction 4/5    Right/Left Knee Right;Left    Right Knee Flexion 5/5    Right Knee Extension 5/5    Left Knee Flexion 5/5    Left Knee Extension 4+/5      Flexibility   Soft Tissue Assessment /Muscle Length --   tight quad and It band on Lt     Palpation   Patella mobility very mild lateral patella tracking                      Objective measurements completed on examination: See above findings.               PT Education - 07/09/20 1648    Education Details HEP, POC    Person(s) Educated Patient    Methods Explanation;Demonstration;Verbal cues;Handout    Comprehension Verbalized understanding            PT Short Term Goals - 07/09/20 1700      PT SHORT TERM GOAL #1   Title Pt will be I and compliant with HEP    Time 4    Period Weeks    Status New    Target Date 08/06/20             PT Long Term Goals - 07/09/20 1700      PT LONG TERM GOAL #1   Title Pt will improve FOTO score to 70% functional outcome score.    Time  8    Period Weeks    Status New    Target Date 09/03/20      PT LONG TERM GOAL #2   Title Pt will improve Lt hip/knee strength to 5/5 MMT and improve Lt SL reach test to at least 18.5 inches    Time 8    Period Weeks     Status New      PT LONG TERM GOAL #3   Title Pt will report no more than 3/10 pain with stairs or biking.    Time 8    Period Weeks    Status New                  Plan - 07/09/20 1650    Clinical Impression Statement Pt presents with Chronic Lt knee pain and Patellofemoral chondromalacia. He will benefit from skilled PT to address his functional defiicts and to reduce overall knee pain with activity.    Examination-Activity Limitations Bend;Carry;Squat;Stairs    Examination-Participation Restrictions Community Activity;Yard Work;Other   biking, stairs   Stability/Clinical Decision Making Stable/Uncomplicated    Clinical Decision Making Low    Rehab Potential Good    PT Frequency 1x / week   1-2   PT Duration 8 weeks    PT Treatment/Interventions ADLs/Self Care Home Management;Cryotherapy;Electrical Stimulation;Iontophoresis 4mg /ml Dexamethasone;Moist Heat;Ultrasound;Therapeutic activities;Therapeutic exercise;Neuromuscular re-education;Manual techniques;Passive range of motion;Dry needling;Joint Manipulations;Vasopneumatic Device;Taping    PT Next Visit Plan review and update HEP PRN, needs hip abd/add strength, quad strength and stetching    PT Home Exercise Plan Access Code: [email or web address]    Consulted and Agree with Plan of Care Patient           Patient will benefit from skilled therapeutic intervention in order to improve the following deficits and impairments:  Decreased activity tolerance, Decreased range of motion, Decreased strength, Impaired flexibility, Pain  Visit Diagnosis: Chronic pain of left knee  Stiffness of left knee, not elsewhere classified  Muscle weakness (generalized)     Problem List Patient Active Problem List   Diagnosis Date Noted  . Chronic pain of left knee 07/28/2019  . 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 Colon cancer [facility]) 07/29/2011    Silvestre Mesi 07/09/2020, 5:04 PM  [facility] Physical Therapy [address] [place], [place], [address] Phone: [phone]   Fax:  [phone]  Name: Derek Crosby MRN: [id number] Date of Birth: 1961-05-21

## 2020-07-19 ENCOUNTER — Encounter: Payer: 59 | Admitting: Rehabilitative and Restorative Service Providers"

## 2020-07-26 ENCOUNTER — Telehealth: Payer: Self-pay | Admitting: Rehabilitative and Restorative Service Providers"

## 2020-07-26 ENCOUNTER — Encounter: Payer: 59 | Admitting: Rehabilitative and Restorative Service Providers"

## 2020-07-26 NOTE — Telephone Encounter (Signed)
Derek Crosby at 10:34 to let him know he had a 10:15 appointment.  He said he was on his way and I encouraged him to keep his appointment next Friday at 10:15 AM.

## 2020-07-27 ENCOUNTER — Ambulatory Visit: Payer: 59 | Attending: Internal Medicine

## 2020-07-27 DIAGNOSIS — Z23 Encounter for immunization: Secondary | ICD-10-CM

## 2020-07-27 NOTE — Progress Notes (Signed)
° °  Covid-19 Vaccination Clinic  Name:  Derek Crosby    MRN: [id number] DOB: [birth year]  07/27/2020  Mr. Pianka was observed post Covid-19 immunization for 15 minutes without incident. He was provided with Vaccine Information Sheet and instruction to access the V-Safe system.   Mr. Herder was instructed to call 911 with any severe reactions post vaccine:  Difficulty breathing   Swelling of face and throat   A fast heartbeat   A bad rash all over body   Dizziness and weakness   Immunizations Administered    Name Date Dose VIS Date [place] COVID-19 Vaccine 07/27/2020  2:07 PM 0.3 mL 07/03/2020 Intramuscular   Manufacturer: [place]   Lot: [id number]   [place]: [id number]

## 2020-08-02 ENCOUNTER — Ambulatory Visit (INDEPENDENT_AMBULATORY_CARE_PROVIDER_SITE_OTHER): Payer: 59 | Admitting: Rehabilitative and Restorative Service Providers"

## 2020-08-02 ENCOUNTER — Other Ambulatory Visit: Payer: Self-pay

## 2020-08-02 ENCOUNTER — Encounter: Payer: Self-pay | Admitting: Rehabilitative and Restorative Service Providers"

## 2020-08-02 DIAGNOSIS — M25562 Pain in left knee: Secondary | ICD-10-CM

## 2020-08-02 DIAGNOSIS — M25662 Stiffness of left knee, not elsewhere classified: Secondary | ICD-10-CM | POA: Diagnosis not present

## 2020-08-02 DIAGNOSIS — R262 Difficulty in walking, not elsewhere classified: Secondary | ICD-10-CM | POA: Diagnosis not present

## 2020-08-02 DIAGNOSIS — M6281 Muscle weakness (generalized): Secondary | ICD-10-CM

## 2020-08-02 DIAGNOSIS — G8929 Other chronic pain: Secondary | ICD-10-CM

## 2020-08-02 NOTE — Therapy (Signed)
[place], [place], [address] Phone: [phone]   Fax:  [phone]  Physical Therapy Treatment  Patient Details  Name: Derek Crosby MRN: [id number] Date of Birth: 11-12-1960 Referring Provider (PT): Frankey Shown MD   Encounter Date: 08/02/2020   PT End of Session - 08/02/20 1107    Visit Number 2    Number of Visits 12    Date for PT Re-Evaluation 08/20/20    PT Start Time 1012    PT Stop Time 1056    PT Time Calculation (min) 44 min    Activity Tolerance Patient tolerated treatment well    Behavior During Therapy [facility] for tasks assessed/performed           Past Medical History:  Diagnosis Date  . Arthritis   . colon ca dx'd 02/05/11  . PONV (postoperative nausea and vomiting)     Past Surgical History:  Procedure Laterality Date  . CYSTOSCOPY/RETROGRADE/URETEROSCOPY  2009   jj stent-rt-kidney stone  . LITHOTRIPSY    . LUMBAR LAMINECTOMY/DECOMPRESSION MICRODISCECTOMY Right 10/03/2013   Procedure: Right LUMBAR FIVE SACRAL ONE  Diskectomy     ;  Surgeon: Faythe Ghee, MD;  Location: [place] [facility];  Service: Neurosurgery;  Laterality: Right;  . MASS EXCISION  04/22/2012   Procedure: EXCISION MASS;  Surgeon: Cammie Sickle., MD;  Location: [place];  Service: Orthopedics;  Laterality: Right;  Right ring cyst excision  . MICRODISCECTOMY LUMBAR  1997  . PARTIAL COLECTOMY  2012   sigmoid-cancer  . PORT-A-CATH REMOVAL  2012   insertion and out    There were no vitals filed for this visit.   Subjective Assessment - 08/02/20 1104    Subjective Derek Crosby reports 70+ hour work weeks are making HEP compliance a challenge.    Pertinent History PMH: lumbar decompression  2015    Limitations Standing;Walking;Lifting;Other (comment)    Diagnostic tests Lt knee MRI 08/2019: "Focal areas of full-thickness cartilage loss along the lateral aspect of the trochlear groove and the anterior aspect of  thelateral femoral condyle with subcortical edema. Trace joint effusion with small complex Baker's cyst."    Patient Stated Goals reduce pain, strengthen knee    Currently in Pain? No/denies    Pain Onset More than a month ago    Pain Frequency Intermittent    Aggravating Factors  WB flexion, stairs, squatting and twisting    Multiple Pain Sites No              [facility] PT Assessment - 08/02/20 0001      Observation/Other Assessments   Observations 3 cm of thigh atrophy on the L assessed 15 cm proximal to the superior patellar pole                         [facility] Adult PT Treatment/Exercise - 08/02/20 0001      Exercises   Exercises Knee/Hip      Knee/Hip Exercises: Stretches   Quad Stretch Left;3 reps;30 seconds   Supine with belt   ITB Stretch Left;3 reps;30 seconds   With belt     Knee/Hip Exercises: Standing   Other Standing Knee Exercises Hip hike off 4 inch step with step-down 2 sets of 10 with slow eccentrics.  Do not lock knee in extension.    Other Standing Knee Exercises Mini-lunges stepping forward with L foot 10X      Knee/Hip Exercises: Seated  Long [employer] Strengthening;Both;4 sets;5 reps   Seated straight leg raises with 1#   Sit to [employer] 10 reps;without UE support   slow eccentrics     Knee/Hip Exercises: Sidelying   Hip ABduction Strengthening;Left;2 sets;10 reps   1/4 turn to stomach                 PT Education - 08/02/20 1105    Education Details Discussed squeezing short bouts of exercise in during the day (sit to stand, seated straight leg raises) and making time in the morning for others (stretching and side lie hip abduction).    Person(s) Educated Patient    Methods Explanation;Demonstration;Tactile cues;Verbal cues;Handout    Comprehension Verbalized understanding;Tactile cues required;Need further instruction;Returned demonstration;Verbal cues required            PT Short Term Goals - 08/02/20 1106      PT SHORT TERM  GOAL #1   Title Pt will be I and compliant with HEP    Time 4    Period Weeks    Status On-going    Target Date 08/06/20             PT Long Term Goals - 08/02/20 1106      PT LONG TERM GOAL #1   Title Pt will improve FOTO score to 70% functional outcome score.    Time 8    Period Weeks    Status On-going      PT LONG TERM GOAL #2   Title Pt will improve Lt hip/knee strength to 5/5 MMT and improve Lt SL reach test to at least 18.5 inches    Time 8    Period Weeks    Status On-going      PT LONG TERM GOAL #3   Title Pt will report no more than 3/10 pain with stairs or biking.    Time 8    Period Weeks    Status On-going                 Plan - 08/02/20 1107    Clinical Impression Statement Derek Crosby is excited to do his HEP but is finding his work schedule makes it difficult to get 1-2X/day.  We discussed squeezing activities in during the day and making time in the morning to get at least 1 set in.  3 cm of L thigh atrophy (assessed 15 cm superior to the patellar pole) will require ~ 3 months of consistent compliance to improve.    Examination-Activity Limitations Bend;Carry;Squat;Stairs    Examination-Participation Restrictions Community Activity;Yard Work;Other   biking, stairs   Stability/Clinical Decision Making Stable/Uncomplicated    Rehab Potential Good    PT Frequency 1x / week   1-2   PT Duration 8 weeks    PT Treatment/Interventions ADLs/Self Care Home Management;Cryotherapy;Electrical Stimulation;Iontophoresis 4mg /ml Dexamethasone;Moist Heat;Ultrasound;Therapeutic activities;Therapeutic exercise;Neuromuscular re-education;Manual techniques;Passive range of motion;Dry needling;Joint Manipulations;Vasopneumatic Device;Taping    PT Next Visit Plan review and update HEP PRN, needs hip abd/add strength, quad strength and stetching    PT Home Exercise Plan Access Code: [email or web address]    Consulted and Agree with Plan of Care Patient           Patient will benefit  from skilled therapeutic intervention in order to improve the following deficits and impairments:  Decreased activity tolerance, Decreased range of motion, Decreased strength, Impaired flexibility, Pain  Visit Diagnosis: Difficulty walking  Stiffness of left knee, not elsewhere classified  Chronic pain of left knee  Muscle  weakness (generalized)     Problem List Patient Active Problem List   Diagnosis Date Noted  . Chronic pain of left knee 07/28/2019  . 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 Colon cancer ([place]) 07/29/2011    Farley Ly PT, MPT 08/02/2020, 11:11 AM  [facility] [address] [place], [place], [address] Phone: [phone]   Fax:  [phone]  Name: Derek Crosby MRN: [id number] Date of Birth: 05/06/1961

## 2020-08-05 ENCOUNTER — Ambulatory Visit (INDEPENDENT_AMBULATORY_CARE_PROVIDER_SITE_OTHER): Payer: 59 | Admitting: Rehabilitative and Restorative Service Providers"

## 2020-08-05 ENCOUNTER — Other Ambulatory Visit: Payer: Self-pay

## 2020-08-05 ENCOUNTER — Encounter: Payer: Self-pay | Admitting: Rehabilitative and Restorative Service Providers"

## 2020-08-05 DIAGNOSIS — M6281 Muscle weakness (generalized): Secondary | ICD-10-CM | POA: Diagnosis not present

## 2020-08-05 DIAGNOSIS — G8929 Other chronic pain: Secondary | ICD-10-CM

## 2020-08-05 DIAGNOSIS — R262 Difficulty in walking, not elsewhere classified: Secondary | ICD-10-CM

## 2020-08-05 DIAGNOSIS — M25662 Stiffness of left knee, not elsewhere classified: Secondary | ICD-10-CM | POA: Diagnosis not present

## 2020-08-05 DIAGNOSIS — M25562 Pain in left knee: Secondary | ICD-10-CM

## 2020-08-05 NOTE — Therapy (Signed)
[facility] Physical Therapy [address] [place], [place], [address] Phone: [phone]   Fax:  [phone]  Physical Therapy Treatment  Patient Details  Name: Derek Crosby MRN: [id number] Date of Birth: 09-01-1961 Referring Provider (PT): Frankey Shown MD   Encounter Date: 08/05/2020   PT End of Session - 08/05/20 1432    Visit Number 3    Number of Visits 12    Date for PT Re-Evaluation 08/20/20    PT Start Time 1301    PT Stop Time 1345    PT Time Calculation (min) 44 min    Activity Tolerance Patient tolerated treatment well    Behavior During Therapy [facility] for tasks assessed/performed           Past Medical History:  Diagnosis Date  . Arthritis   . colon ca dx'd 02/05/11  . PONV (postoperative nausea and vomiting)     Past Surgical History:  Procedure Laterality Date  . CYSTOSCOPY/RETROGRADE/URETEROSCOPY  2009   jj stent-rt-kidney stone  . LITHOTRIPSY    . LUMBAR LAMINECTOMY/DECOMPRESSION MICRODISCECTOMY Right 10/03/2013   Procedure: Right LUMBAR FIVE SACRAL ONE  Diskectomy     ;  Surgeon: Faythe Ghee, MD;  Location: [place] [facility];  Service: Neurosurgery;  Laterality: Right;  . MASS EXCISION  04/22/2012   Procedure: EXCISION MASS;  Surgeon: Cammie Sickle., MD;  Location: [place];  Service: Orthopedics;  Laterality: Right;  Right ring cyst excision  . MICRODISCECTOMY LUMBAR  1997  . PARTIAL COLECTOMY  2012   sigmoid-cancer  . PORT-A-CATH REMOVAL  2012   insertion and out    There were no vitals filed for this visit.   Subjective Assessment - 08/05/20 1348    Subjective Derek Crosby reports better HEP compliance over the weekend vs his 70+ hour work week.    Pertinent History PMH: lumbar decompression  2015    Limitations Standing;Walking;Lifting;Other (comment)    Diagnostic tests Lt knee MRI 08/2019: "Focal areas of full-thickness cartilage loss along the lateral aspect of the trochlear groove and the anterior aspect of  thelateral femoral condyle with subcortical edema. Trace joint effusion with small complex Baker's cyst."    Patient Stated Goals reduce pain, strengthen knee    Currently in Pain? No/denies    Pain Onset More than a month ago    Aggravating Factors  Certain positions he gets a sharp pain    Multiple Pain Sites No                             [facility] Adult PT Treatment/Exercise - 08/05/20 0001      Neuro Re-ed    Neuro Re-ed Details  Balance board 2X 1 minute      Exercises   Exercises Knee/Hip      Knee/Hip Exercises: Stretches   Quad Stretch Left;3 reps;30 seconds   Standing   ITB Stretch Left;3 reps;30 seconds   With belt     Knee/Hip Exercises: Machines for Strengthening   Cybex Knee Extension 10X slow eccentrics 90-20 degrees 25#    Cybex Leg Press L only 75# 10X slow eccentrics      Knee/Hip Exercises: Standing   Other Standing Knee Exercises Hip hike off 4 inch step with step-down 2 sets of 10 with slow eccentrics.  Do not lock knee in extension.    Other Standing Knee Exercises Mini-lunges stepping forward with L foot 10X  Knee/Hip Exercises: Seated   Long Arc Quad Strengthening;Both;4 sets;5 reps   Seated straight leg raises with 1#   Sit to Sand 10 reps;without UE support   slow eccentrics     Knee/Hip Exercises: Sidelying   Hip ABduction Strengthening;Left;2 sets;10 reps   1/4 turn to stomach                 PT Education - 08/05/20 1431    Education Details Reviewed and progressed Derek Crosby HEP.    Person(s) Educated Patient    Methods Explanation;Demonstration;Verbal cues    Comprehension Verbalized understanding;Returned demonstration;Verbal cues required;Need further instruction            PT Short Term Goals - 08/05/20 1432      PT SHORT TERM GOAL #1   Title Pt will be I and compliant with HEP    Time 4    Period Weeks    Status Achieved    Target Date 08/06/20             PT Long Term Goals - 08/05/20 1432       PT LONG TERM GOAL #1   Title Pt will improve FOTO score to 70% functional outcome score.    Time 8    Period Weeks    Status On-going      PT LONG TERM GOAL #2   Title Pt will improve Lt hip/knee strength to 5/5 MMT and improve Lt SL reach test to at least 18.5 inches    Time 8    Period Weeks    Status On-going      PT LONG TERM GOAL #3   Title Pt will report no more than 3/10 pain with stairs or biking.    Time 8    Period Weeks    Status On-going                 Plan - 08/05/20 1433    Clinical Impression Statement Derek Crosby notes better compliance over the weekend.  3 cm of L thigh atrophy is being addressed with a variety of quadriceps focused strengthening activities.  Continue supervised strength work to meet long-term goals.    Examination-Activity Limitations Bend;Carry;Squat;Stairs    Examination-Participation Restrictions Community Activity;Yard Work;Other   biking, stairs   Stability/Clinical Decision Making Stable/Uncomplicated    Rehab Potential Good    PT Frequency 1x / week   1-2   PT Duration 8 weeks    PT Treatment/Interventions ADLs/Self Care Home Management;Cryotherapy;Electrical Stimulation;Iontophoresis 4mg /ml Dexamethasone;Moist Heat;Ultrasound;Therapeutic activities;Therapeutic exercise;Neuromuscular re-education;Manual techniques;Passive range of motion;Dry needling;Joint Manipulations;Vasopneumatic Device;Taping    PT Next Visit Plan review and update HEP PRN, needs hip abd/add strength, quad strength and stetching    PT Home Exercise Plan Access Code: [email or web address]    Consulted and Agree with Plan of Care Patient           Patient will benefit from skilled therapeutic intervention in order to improve the following deficits and impairments:  Decreased activity tolerance, Decreased range of motion, Decreased strength, Impaired flexibility, Pain  Visit Diagnosis: Difficulty walking  Muscle weakness (generalized)  Chronic pain of left knee  Stiffness  of left knee, not elsewhere classified     Problem List Patient Active Problem List   Diagnosis Date Noted  . Chronic pain of left knee 07/28/2019  . 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 Colon cancer ([place]) 07/29/2011    Farley Ly PT, MPT 08/05/2020, 2:35 PM  [facility] Physical Therapy [phone]  [place], [place], [address] Phone: [phone]   Fax:  [phone]  Name: Derek Crosby MRN: [id number] Date of Birth: [birth year]

## 2020-08-06 ENCOUNTER — Encounter: Payer: 59 | Admitting: Rehabilitative and Restorative Service Providers"

## 2020-08-16 ENCOUNTER — Other Ambulatory Visit: Payer: Self-pay

## 2020-08-16 ENCOUNTER — Ambulatory Visit (INDEPENDENT_AMBULATORY_CARE_PROVIDER_SITE_OTHER): Payer: 59 | Admitting: Physical Therapy

## 2020-08-16 DIAGNOSIS — M6281 Muscle weakness (generalized): Secondary | ICD-10-CM

## 2020-08-16 DIAGNOSIS — R262 Difficulty in walking, not elsewhere classified: Secondary | ICD-10-CM

## 2020-08-16 DIAGNOSIS — M25562 Pain in left knee: Secondary | ICD-10-CM | POA: Diagnosis not present

## 2020-08-16 DIAGNOSIS — M25662 Stiffness of left knee, not elsewhere classified: Secondary | ICD-10-CM

## 2020-08-16 DIAGNOSIS — G8929 Other chronic pain: Secondary | ICD-10-CM

## 2020-08-16 NOTE — Therapy (Signed)
[facility] Physical Therapy [address] [place], [place], [address] Phone: [phone]   Fax:  [phone]  Physical Therapy Treatment  Patient Details  Name: ZAYIN VALADEZ MRN: [id number] Date of Birth: 12-14-1960 Referring Provider (PT): Frankey Shown MD   Encounter Date: 08/16/2020   PT End of Session - 08/16/20 1043    Visit Number 4    Number of Visits 12    Date for PT Re-Evaluation 08/20/20    PT Start Time 3825    PT Stop Time 0539    PT Time Calculation (min) 38 min    Activity Tolerance Patient tolerated treatment well    Behavior During Therapy [facility] for tasks assessed/performed           Past Medical History:  Diagnosis Date   Arthritis    colon ca dx'd 02/05/11   PONV (postoperative nausea and vomiting)     Past Surgical History:  Procedure Laterality Date   CYSTOSCOPY/RETROGRADE/URETEROSCOPY  2009   jj stent-rt-kidney stone   LITHOTRIPSY     LUMBAR LAMINECTOMY/DECOMPRESSION MICRODISCECTOMY Right 10/03/2013   Procedure: Right LUMBAR FIVE SACRAL ONE  Diskectomy     ;  Surgeon: Faythe Ghee, MD;  Location: [facility];  Service: Neurosurgery;  Laterality: Right;   MASS EXCISION  04/22/2012   Procedure: EXCISION MASS;  Surgeon: Cammie Sickle., MD;  Location: [place];  Service: Orthopedics;  Laterality: Right;  Right ring cyst excision   MICRODISCECTOMY LUMBAR  1997   PARTIAL COLECTOMY  2012   sigmoid-cancer   PORT-A-CATH REMOVAL  2012   insertion and out    There were no vitals filed for this visit.   Subjective Assessment - 08/16/20 1040    Subjective He relays some improvements in overall knee pain, pain is intermittent and no pain upon arrival today    Pertinent History PMH: lumbar decompression  2015    Limitations Standing;Walking;Lifting;Other (comment)    Diagnostic tests Lt knee MRI 08/2019: "Focal areas of full-thickness cartilage loss along the lateral aspect of the trochlear groove and the  anterior aspect of thelateral femoral condyle with subcortical edema. Trace joint effusion with small complex Baker's cyst."    Patient Stated Goals reduce pain, strengthen knee    Pain Onset More than a month ago             [facility] Adult PT Treatment/Exercise - 08/16/20 0001      Knee/Hip Exercises: Stretches   Sports administrator Left;3 reps;30 seconds    Quad Stretch Limitations supine with strap, leg off EOB    ITB Stretch Left;3 reps;30 seconds    ITB Stretch Limitations supine with strap      Knee/Hip Exercises: Machines for Strengthening   Cybex Knee Extension 10X2 slow eccentrics 90-20 degrees 25#    Cybex Leg Press L only 75# 10X3 slow eccentrics      Knee/Hip Exercises: Standing   Other Standing Knee Exercises Hip hike off 4 inch step with step-down 2 sets of 10 with slow eccentrics.  Do not lock knee in extension.    Other Standing Knee Exercises TRX lunges X 15 bilat, cursy lunge X 15 bilat      Knee/Hip Exercises: Seated   Long Arc Quad Limitations seated SLR 2 lbs 2X10 slow eccentrics    Sit to Sand 2 sets;10 reps;without UE support   up with both, down with left only eccentrics 25.5 inch  PT Short Term Goals - 08/05/20 1432      PT SHORT TERM GOAL #1   Title Pt will be I and compliant with HEP    Time 4    Period Weeks    Status Achieved    Target Date 08/06/20             PT Long Term Goals - 08/05/20 1432      PT LONG TERM GOAL #1   Title Pt will improve FOTO score to 70% functional outcome score.    Time 8    Period Weeks    Status On-going      PT LONG TERM GOAL #2   Title Pt will improve Lt hip/knee strength to 5/5 MMT and improve Lt SL reach test to at least 18.5 inches    Time 8    Period Weeks    Status On-going      PT LONG TERM GOAL #3   Title Pt will report no more than 3/10 pain with stairs or biking.    Time 8    Period Weeks    Status On-going                 Plan - 08/16/20 1044    Clinical  Impression Statement Progressed functional strengthening closed chain activiites today with good tolerance and without complaints. He appears to be making early progress with PT but still will need further quad strengthening.    Examination-Activity Limitations Bend;Carry;Squat;Stairs    Examination-Participation Restrictions Community Activity;Yard Work;Other   biking, stairs   Stability/Clinical Decision Making Stable/Uncomplicated    Rehab Potential Good    PT Frequency 1x / week   1-2   PT Duration 8 weeks    PT Treatment/Interventions ADLs/Self Care Home Management;Cryotherapy;Electrical Stimulation;Iontophoresis 4mg /ml Dexamethasone;Moist Heat;Ultrasound;Therapeutic activities;Therapeutic exercise;Neuromuscular re-education;Manual techniques;Passive range of motion;Dry needling;Joint Manipulations;Vasopneumatic Device;Taping    PT Next Visit Plan review and update HEP PRN, needs hip abd/add strength, quad strength and stetching    PT Home Exercise Plan Access Code: [email or web address]    Consulted and Agree with Plan of Care Patient           Patient will benefit from skilled therapeutic intervention in order to improve the following deficits and impairments:  Decreased activity tolerance, Decreased range of motion, Decreased strength, Impaired flexibility, Pain  Visit Diagnosis: Difficulty walking  Muscle weakness (generalized)  Chronic pain of left knee  Stiffness of left knee, not elsewhere classified     Problem List Patient Active Problem List   Diagnosis Date Noted   Chronic pain of left knee 07/28/2019   Lumbar disc herniation 10/03/2013   Colon cancer ([place]) 07/29/2011    Silvestre Mesi 08/16/2020, 10:58 AM  [facility] Physical Therapy [address] [place], [place], [address] Phone: [phone]   Fax:  [phone]  Name: KYZER BLOWE MRN: [id number] Date of Birth: 1960/10/26

## 2020-08-21 ENCOUNTER — Other Ambulatory Visit: Payer: Self-pay

## 2020-08-21 ENCOUNTER — Ambulatory Visit (INDEPENDENT_AMBULATORY_CARE_PROVIDER_SITE_OTHER): Payer: 59 | Admitting: Rehabilitative and Restorative Service Providers"

## 2020-08-21 ENCOUNTER — Encounter: Payer: Self-pay | Admitting: Rehabilitative and Restorative Service Providers"

## 2020-08-21 DIAGNOSIS — M25662 Stiffness of left knee, not elsewhere classified: Secondary | ICD-10-CM | POA: Diagnosis not present

## 2020-08-21 DIAGNOSIS — M6281 Muscle weakness (generalized): Secondary | ICD-10-CM | POA: Diagnosis not present

## 2020-08-21 DIAGNOSIS — M25562 Pain in left knee: Secondary | ICD-10-CM

## 2020-08-21 DIAGNOSIS — R262 Difficulty in walking, not elsewhere classified: Secondary | ICD-10-CM | POA: Diagnosis not present

## 2020-08-21 DIAGNOSIS — G8929 Other chronic pain: Secondary | ICD-10-CM

## 2020-08-21 NOTE — Therapy (Addendum)
[facility] Physical Therapy [address] [place], [place], [address] Phone: [phone]   Fax:  [phone]  Physical Therapy Treatment  Patient Details  Name: Derek Crosby MRN: [id number] Date of Birth: [birth year] Referring Provider (PT): Frankey Shown MD  PHYSICAL THERAPY DISCHARGE SUMMARY  Visits from Start of Care: 5  Current functional level related to goals / functional outcomes: See note   Remaining deficits: See note   Education / Equipment: HEP Plan:                                                    Patient goals were partially met. Patient is being discharged due to not returning since the last visit.  ?????     Encounter Date: 08/21/2020   PT End of Session - 08/21/20 1745    Visit Number 5    Number of Visits 12    Date for PT Re-Evaluation 08/20/20    PT Start Time 9563    PT Stop Time 8756    PT Time Calculation (min) 44 min    Activity Tolerance Patient tolerated treatment well;No increased pain    Behavior During Therapy [facility] for tasks assessed/performed           Past Medical History:  Diagnosis Date  . Arthritis   . colon ca dx'd 02/05/11  . PONV (postoperative nausea and vomiting)     Past Surgical History:  Procedure Laterality Date  . CYSTOSCOPY/RETROGRADE/URETEROSCOPY  2009   jj stent-rt-kidney stone  . LITHOTRIPSY    . LUMBAR LAMINECTOMY/DECOMPRESSION MICRODISCECTOMY Right 10/03/2013   Procedure: Right LUMBAR FIVE SACRAL ONE  Diskectomy     ;  Surgeon: Faythe Ghee, MD;  Location: [place] [facility];  Service: Neurosurgery;  Laterality: Right;  . MASS EXCISION  04/22/2012   Procedure: EXCISION MASS;  Surgeon: Cammie Sickle., MD;  Location: [place];  Service: Orthopedics;  Laterality: Right;  Right ring cyst excision  . MICRODISCECTOMY LUMBAR  1997  . PARTIAL COLECTOMY  2012   sigmoid-cancer  . PORT-A-CATH REMOVAL  2012   insertion and out    There were no vitals filed for this visit.    Subjective Assessment - 08/21/20 1542    Subjective Derek Crosby notes a decrease in overall L knee pain.  He was able to ride a bike without pain for the first time.  Less pain with stairs and prolonged sitting.    Pertinent History PMH: lumbar decompression  2015    Limitations Standing;Walking;Lifting;Other (comment)    Diagnostic tests Lt knee MRI 08/2019: "Focal areas of full-thickness cartilage loss along the lateral aspect of the trochlear groove and the anterior aspect of thelateral femoral condyle with subcortical edema. Trace joint effusion with small complex Baker's cyst."    Patient Stated Goals reduce pain, strengthen knee    Currently in Pain? No/denies    Pain Score 0-No pain    Pain Onset More than a month ago    Pain Frequency Intermittent    Aggravating Factors  Prolonged sitting    Pain Relieving Factors Exercises and change of position    Effect of Pain on Daily Activities Better with stairs and prolonged sitting but still limited    Multiple Pain Sites No  [place] Adult PT Treatment/Exercise - 08/21/20 0001      Exercises   Exercises Knee/Hip      Knee/Hip Exercises: Stretches   Quad Stretch Left;3 reps;30 seconds    Quad Stretch Limitations supine with strap, leg off EOB    ITB Stretch Left;3 reps;30 seconds    ITB Stretch Limitations supine with strap      Knee/Hip Exercises: Machines for Strengthening   Cybex Knee Extension 2 sets of 20 slow eccentrics 90-20 degrees 25#    Cybex Leg Press L only 87# 2 sets of 10X slow eccentrics      Knee/Hip Exercises: Seated   Long Arc Quad Strengthening;Both;4 sets;5 reps   Seated straight leg raises with 1#   Sit to [employer] 10 reps;without UE support   slow eccentrics     Knee/Hip Exercises: Sidelying   Hip ABduction Strengthening;Left;2 sets;10 reps   1/4 turn to stomach                 PT Education - 08/21/20 1744    Education Details Reviewed and progressed Derek Crosby's  strength program.  Focus on seated SLR and quadriceps strength work at home.    Person(s) Educated Patient    Methods Explanation;Demonstration;Verbal cues    Comprehension Verbalized understanding;Need further instruction;Returned demonstration;Verbal cues required            PT Short Term Goals - 08/21/20 1744      PT SHORT TERM GOAL #1   Title Pt will be I and compliant with HEP    Time 4    Period Weeks    Status Achieved    Target Date 08/06/20             PT Long Term Goals - 08/21/20 1744      PT LONG TERM GOAL #1   Title Pt will improve FOTO score to 70% functional outcome score.    Time 8    Period Weeks    Status On-going      PT LONG TERM GOAL #2   Title Pt will improve Lt hip/knee strength to 5/5 MMT and improve Lt SL reach test to at least 18.5 inches    Time 8    Period Weeks    Status On-going      PT LONG TERM GOAL #3   Title Pt will report no more than 3/10 pain with stairs or biking.    Time 8    Period Weeks    Status On-going                 Plan - 08/21/20 1745    Clinical Impression Statement Derek Crosby notes he was able to ride a bike without knee pain for the first time in a while.  I encouraged him to try adding bike rides to his HEP emphasizing quadriceps strengthening.  Continue quadriceps strength emphasis to address significant thigh atrophy and functional impairments noted at evaluation.    Examination-Activity Limitations Bend;Carry;Squat;Stairs    Examination-Participation Restrictions Community Activity;Yard Work;Other   biking, stairs   Stability/Clinical Decision Making Stable/Uncomplicated    Rehab Potential Good    PT Frequency 1x / week   1-2   PT Duration 8 weeks    PT Treatment/Interventions ADLs/Self Care Home Management;Cryotherapy;Electrical Stimulation;Iontophoresis 48m/ml Dexamethasone;Moist Heat;Ultrasound;Therapeutic activities;Therapeutic exercise;Neuromuscular re-education;Manual techniques;Passive range of  motion;Dry needling;Joint Manipulations;Vasopneumatic Device;Taping    PT Next Visit Plan review and update HEP PRN, needs hip abd/add strength, quad strength and stetching    PT  Home Exercise Plan Access Code: [email or web address]    [email or web address] and Agree with Plan of Care Patient           Patient will benefit from skilled therapeutic intervention in order to improve the following deficits and impairments:  Decreased activity tolerance, Decreased range of motion, Decreased strength, Impaired flexibility, Pain  Visit Diagnosis: Difficulty walking  Muscle weakness (generalized)  Chronic pain of left knee  Stiffness of left knee, not elsewhere classified     Problem List Patient Active Problem List   Diagnosis Date Noted  . Chronic pain of left knee 07/28/2019  . 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 Colon cancer ([place]) 07/29/2011    Farley Ly PT, MPT 08/21/2020, 5:47 PM  [facility] Physical Therapy [address] [place], [place], [address] Phone: [phone]   Fax:  [phone]  Name: VENUS RUHE MRN: [id number] Date of Birth: 01/13/1961

## 2021-01-16 DIAGNOSIS — D225 Melanocytic nevi of trunk: Secondary | ICD-10-CM | POA: Diagnosis not present

## 2021-01-16 DIAGNOSIS — L570 Actinic keratosis: Secondary | ICD-10-CM | POA: Diagnosis not present

## 2021-01-16 DIAGNOSIS — Z85828 Personal history of other malignant neoplasm of skin: Secondary | ICD-10-CM | POA: Diagnosis not present

## 2021-01-16 DIAGNOSIS — D2262 Melanocytic nevi of left upper limb, including shoulder: Secondary | ICD-10-CM | POA: Diagnosis not present

## 2021-01-16 DIAGNOSIS — L820 Inflamed seborrheic keratosis: Secondary | ICD-10-CM | POA: Diagnosis not present

## 2021-04-17 ENCOUNTER — Other Ambulatory Visit (HOSPITAL_BASED_OUTPATIENT_CLINIC_OR_DEPARTMENT_OTHER): Payer: Self-pay

## 2021-04-17 ENCOUNTER — Ambulatory Visit: Payer: Self-pay | Attending: Internal Medicine

## 2021-04-17 ENCOUNTER — Other Ambulatory Visit: Payer: Self-pay

## 2021-04-17 DIAGNOSIS — Z23 Encounter for immunization: Secondary | ICD-10-CM

## 2021-04-17 MED ORDER — PFIZER-BIONT COVID-19 VAC-TRIS 30 MCG/0.3ML IM SUSP
INTRAMUSCULAR | 0 refills | Status: AC
  Filled 2021-04-17: qty 0.3, 1d supply, fill #0

## 2021-04-17 NOTE — Progress Notes (Signed)
   Covid-19 Vaccination Clinic  Name:  Derek Crosby    MRN: [id number] DOB: 1961-06-24  04/17/2021  [name] was observed post Covid-19 immunization for 15 minutes without incident. He was provided with Vaccine Information Sheet and instruction to access the V-Safe system.   [name] was instructed to call 911 with any severe reactions post vaccine: Difficulty breathing  Swelling of face and throat  A fast heartbeat  A bad rash all over body  Dizziness and weakness   Immunizations Administered     Name Date Dose VIS Date Route   PFIZER Comrnaty(Gray TOP) Covid-19 Vaccine 04/17/2021  2:36 PM 0.3 mL 08/22/2020 Intramuscular   Manufacturer: [place]   Lot: [id number]   [place]: [phone]

## 2021-06-25 DIAGNOSIS — I10 Essential (primary) hypertension: Secondary | ICD-10-CM | POA: Diagnosis not present

## 2021-06-25 DIAGNOSIS — E7800 Pure hypercholesterolemia, unspecified: Secondary | ICD-10-CM | POA: Diagnosis not present

## 2021-06-25 DIAGNOSIS — Z125 Encounter for screening for malignant neoplasm of prostate: Secondary | ICD-10-CM | POA: Diagnosis not present

## 2021-06-25 DIAGNOSIS — R7301 Impaired fasting glucose: Secondary | ICD-10-CM | POA: Diagnosis not present

## 2021-06-25 DIAGNOSIS — Z23 Encounter for immunization: Secondary | ICD-10-CM | POA: Diagnosis not present

## 2021-06-25 DIAGNOSIS — Z0000 Encounter for general adult medical examination without abnormal findings: Secondary | ICD-10-CM | POA: Diagnosis not present

## 2021-09-30 DIAGNOSIS — Z23 Encounter for immunization: Secondary | ICD-10-CM | POA: Diagnosis not present

## 2022-01-19 DIAGNOSIS — D485 Neoplasm of uncertain behavior of skin: Secondary | ICD-10-CM | POA: Diagnosis not present

## 2022-01-19 DIAGNOSIS — Z85828 Personal history of other malignant neoplasm of skin: Secondary | ICD-10-CM | POA: Diagnosis not present

## 2022-01-19 DIAGNOSIS — L570 Actinic keratosis: Secondary | ICD-10-CM | POA: Diagnosis not present

## 2022-01-19 DIAGNOSIS — L814 Other melanin hyperpigmentation: Secondary | ICD-10-CM | POA: Diagnosis not present

## 2022-01-19 DIAGNOSIS — L820 Inflamed seborrheic keratosis: Secondary | ICD-10-CM | POA: Diagnosis not present

## 2022-01-19 DIAGNOSIS — L821 Other seborrheic keratosis: Secondary | ICD-10-CM | POA: Diagnosis not present

## 2022-03-25 ENCOUNTER — Other Ambulatory Visit: Payer: Self-pay

## 2022-06-18 DIAGNOSIS — H353121 Nonexudative age-related macular degeneration, left eye, early dry stage: Secondary | ICD-10-CM | POA: Diagnosis not present

## 2022-06-18 DIAGNOSIS — H524 Presbyopia: Secondary | ICD-10-CM | POA: Diagnosis not present

## 2022-06-18 DIAGNOSIS — D3132 Benign neoplasm of left choroid: Secondary | ICD-10-CM | POA: Diagnosis not present

## 2022-06-18 DIAGNOSIS — H2513 Age-related nuclear cataract, bilateral: Secondary | ICD-10-CM | POA: Diagnosis not present

## 2022-06-18 DIAGNOSIS — H5203 Hypermetropia, bilateral: Secondary | ICD-10-CM | POA: Diagnosis not present

## 2022-07-16 DIAGNOSIS — Z79899 Other long term (current) drug therapy: Secondary | ICD-10-CM | POA: Diagnosis not present

## 2022-07-16 DIAGNOSIS — Z0000 Encounter for general adult medical examination without abnormal findings: Secondary | ICD-10-CM | POA: Diagnosis not present

## 2022-07-16 DIAGNOSIS — I10 Essential (primary) hypertension: Secondary | ICD-10-CM | POA: Diagnosis not present

## 2022-07-16 DIAGNOSIS — Z23 Encounter for immunization: Secondary | ICD-10-CM | POA: Diagnosis not present

## 2022-07-16 DIAGNOSIS — E7800 Pure hypercholesterolemia, unspecified: Secondary | ICD-10-CM | POA: Diagnosis not present

## 2022-07-16 DIAGNOSIS — Z85038 Personal history of other malignant neoplasm of large intestine: Secondary | ICD-10-CM | POA: Diagnosis not present

## 2022-07-16 DIAGNOSIS — R7301 Impaired fasting glucose: Secondary | ICD-10-CM | POA: Diagnosis not present

## 2022-07-16 DIAGNOSIS — Z125 Encounter for screening for malignant neoplasm of prostate: Secondary | ICD-10-CM | POA: Diagnosis not present

## 2022-07-16 DIAGNOSIS — Z5181 Encounter for therapeutic drug level monitoring: Secondary | ICD-10-CM | POA: Diagnosis not present

## 2022-09-15 DIAGNOSIS — D123 Benign neoplasm of transverse colon: Secondary | ICD-10-CM | POA: Diagnosis not present

## 2022-09-15 DIAGNOSIS — Z980 Intestinal bypass and anastomosis status: Secondary | ICD-10-CM | POA: Diagnosis not present

## 2022-09-15 DIAGNOSIS — K649 Unspecified hemorrhoids: Secondary | ICD-10-CM | POA: Diagnosis not present

## 2022-09-15 DIAGNOSIS — Z85038 Personal history of other malignant neoplasm of large intestine: Secondary | ICD-10-CM | POA: Diagnosis not present

## 2022-09-15 DIAGNOSIS — Z08 Encounter for follow-up examination after completed treatment for malignant neoplasm: Secondary | ICD-10-CM | POA: Diagnosis not present

## 2022-11-24 DIAGNOSIS — W458XXA Other foreign body or object entering through skin, initial encounter: Secondary | ICD-10-CM | POA: Diagnosis not present

## 2022-11-24 DIAGNOSIS — L03012 Cellulitis of left finger: Secondary | ICD-10-CM | POA: Diagnosis not present

## 2022-11-24 DIAGNOSIS — S60451A Superficial foreign body of left index finger, initial encounter: Secondary | ICD-10-CM | POA: Diagnosis not present

## 2022-11-26 DIAGNOSIS — L03012 Cellulitis of left finger: Secondary | ICD-10-CM | POA: Diagnosis not present

## 2023-01-21 DIAGNOSIS — Z85828 Personal history of other malignant neoplasm of skin: Secondary | ICD-10-CM | POA: Diagnosis not present

## 2023-01-21 DIAGNOSIS — L814 Other melanin hyperpigmentation: Secondary | ICD-10-CM | POA: Diagnosis not present

## 2023-01-21 DIAGNOSIS — L821 Other seborrheic keratosis: Secondary | ICD-10-CM | POA: Diagnosis not present

## 2023-01-21 DIAGNOSIS — D225 Melanocytic nevi of trunk: Secondary | ICD-10-CM | POA: Diagnosis not present

## 2023-01-21 DIAGNOSIS — D485 Neoplasm of uncertain behavior of skin: Secondary | ICD-10-CM | POA: Diagnosis not present
